# Patient Record
Sex: Female | Born: 1970 | Hispanic: Yes | Smoking: Never smoker
Health system: Southern US, Community
[De-identification: ages and names within clinical notes are randomized; demographics above are authoritative.]

## PROBLEM LIST (undated history)

## (undated) DIAGNOSIS — N920 Excessive and frequent menstruation with regular cycle: Secondary | ICD-10-CM

## (undated) HISTORY — PX: HERNIA REPAIR: SHX51

## (undated) HISTORY — DX: Excessive and frequent menstruation with regular cycle: N92.0

## (undated) HISTORY — PX: CHOLECYSTECTOMY: SHX55

## (undated) HISTORY — PX: PARTIAL HYSTERECTOMY: SHX80

---

## 2008-02-07 HISTORY — PX: REDUCTION MAMMAPLASTY: SUR839

## 2017-02-07 ENCOUNTER — Encounter: Payer: Self-pay | Admitting: Certified Nurse Midwife

## 2017-02-07 ENCOUNTER — Ambulatory Visit (INDEPENDENT_AMBULATORY_CARE_PROVIDER_SITE_OTHER): Payer: BLUE CROSS/BLUE SHIELD | Admitting: Certified Nurse Midwife

## 2017-02-07 VITALS — BP 106/69 | HR 92 | Ht 61.0 in | Wt 146.3 lb

## 2017-02-07 DIAGNOSIS — N6320 Unspecified lump in the left breast, unspecified quadrant: Secondary | ICD-10-CM

## 2017-02-07 NOTE — Progress Notes (Signed)
GYN ENCOUNTER NOTE  Subjective:       Isabel Miller is a 47 y.o. G66P1011 female is here for gynecologic evaluation of the following issues:  1. . Lump of her left breast that she states she noticed one month ago. She states that it has gotten larger and is now painful. She states that her last check up [place] coming [place] everything was normal . She denies any history of cancer for herself her in her immediate family. She states she takes in minimal caffeine - 1 cup coffee a day. She denies high in take of salt.    Gynecologic History No LMP recorded. Patient has had a hysterectomy. Contraception: none and menaupasal due to hysterectomy  Last Pap:2018 in her country all was normal . Last mammogram: 2018 in her Country . Results were: normal  Obstetric History OB History  Gravida Para Term Preterm AB Living  2 1 1   1 1   SAB TAB Ectopic Multiple Live Births  1       1    # Outcome Date GA Lbr Len/2nd Weight Sex Delivery Anes PTL Lv  2 Term 2008    M CS-LTranv   LIV  1 SAB 2006              Past Medical History:  Diagnosis Date  . Heavy menses     Past Surgical History:  Procedure Laterality Date  . ABDOMINAL HYSTERECTOMY    . BREAST REDUCTION SURGERY    . CESAREAN SECTION    . GALLBLADDER SURGERY    . HERNIA REPAIR      No current outpatient medications on file prior to visit.   No current facility-administered medications on file prior to visit.     Allergies  Allergen Reactions  . Ivp Dye [Iodinated Diagnostic Agents] Other (See Comments)    convulsion    Social History   Socioeconomic History  . Marital status: Divorced    Spouse name: Not on file  . Number of children: Not on file  . Years of education: Not on file  . Highest education level: Not on file  Social Needs  . Financial resource strain: Not on file  . Food insecurity - worry: Not on file  . Food insecurity - inability: Not on file  . Transportation needs - medical: Not on file  .  Transportation needs - non-medical: Not on file  Occupational History  . Not on file  Tobacco Use  . Smoking status: Never Smoker  . Smokeless tobacco: Never Used  Substance and Sexual Activity  . Alcohol use: Yes    Comment: occas  . Drug use: No  . Sexual activity: Not Currently  Other Topics Concern  . Not on file  Social History Narrative  . Not on file    Family History  Problem Relation Age of Onset  . Breast cancer Neg Hx   . Ovarian cancer Neg Hx   . Colon cancer Neg Hx   . Diabetes Neg Hx     The following portions of the patient's history were reviewed and updated as appropriate: allergies, current medications, past family history, past medical history, past social history, past surgical history and problem list.  Review of Systems Review of Systems - Breast ROS: positive for - new or changing breast lumps Review of Systems - General ROS: negative for - chills, fatigue, fever, hot flashes, malaise or night sweats Hematological and Lymphatic ROS: negative for - bleeding problems or  swollen lymph nodes Gastrointestinal ROS: negative for - abdominal pain, blood in stools, change in bowel habits and nausea/vomiting Musculoskeletal ROS: negative for - joint pain, muscle pain or muscular weakness Genito-Urinary ROS: negative for - dyspareunia, dysuria, genital discharge, genital ulcers, hematuria, incontinence,  nocturia or pelvic pain  Objective:   BP 106/69   Pulse 92   Ht 5\' 1"  (1.549 m)   Wt 146 lb 4.8 oz (66.4 kg)   BMI 27.64 kg/m  CONSTITUTIONAL: Well-developed, well-nourished female in no acute distress.  HENT:  Normocephalic, atraumatic.  NECK: Normal range of motion, supple,  SKIN: Skin is warm and dry. No rash noted. Not diaphoretic. No erythema. No pallor. [place]: Alert and oriented to person, place, and time.  PSYCHIATRIC: Normal mood and affect. Normal behavior. Normal judgment and thought content. CARDIOVASCULAR:RRR, pink  RESPIRATORY: clear  bilaterally, regular , no signs of distress BREASTS: Bilateral breast reduction. Scars noted on both breast below the breast and around areola. Left breast-irregular  Lump, firm between 11-12 o'clock.  3 cm x 3 cm. Moveable , no redness or swelling. Non tender to palpation. Pt states that it has enlarged over the month.  ABDOMEN: Soft, non distended; Non tender.  No Organomegaly. PELVIC: not indicated MUSCULOSKELETAL: Normal range of motion. No tenderness.  No cyanosis, clubbing, or edema.  Assessment:   1. Left breast mass - US BREAST LTD UNI LEFT INC AXILLA; Future - US BREAST LTD UNI RIGHT INC AXILLA; Future - MM Digital Diagnostic Unilat L; Future - MM DIAG BREAST TOMO BILATERAL; Future   Plan:  Mammogram and ultrasound ordered. Will follow up with results. Pt return if symptoms worsen or PRN.   I attest that more than 50% of time spent discussing pt history, reviewing differential diagnosis,  and reviewing plan of care.   Philip Aspen, CNM

## 2017-02-07 NOTE — Patient Instructions (Signed)
Breast Cyst A breast cyst is a sac in the breast that is filled with fluid. Breast cysts are usually noncancerous (benign). They are common among women, and they are most often located in the upper, outer portion of the breast. One or more cysts may develop. They form when fluid builds up inside of the breast glands. There are several types of breast cysts:  Macrocyst. This is a cyst that is about 2 inches (5.1 cm) across (in diameter).  Microcyst. This is a very small cyst that you cannot feel, but it can be seen with imaging tests such as an X-ray of the breast (mammogram) or ultrasound.  Galactocele. This is a cyst that contains milk. It may develop if you suddenly stop breastfeeding.  Breast cysts do not increase your risk of breast cancer. They usually disappear after menopause, unless you take artificial hormones (are on hormone therapy). What are the causes? The exact cause of breast cysts is not known. Possible causes include:  Blockage of tubes (ducts) in the breast glands, which leads to fluid buildup. Duct blockage may result from: ? Fibrocystic breast changes. This is a common, benign condition that occurs when women go through hormonal changes during the menstrual cycle. This is a common cause of multiple breast cysts. ? Overgrowth of breast tissue or breast glands. ? Scar tissue in the breast from previous surgery.  Changes in certain female hormones (estrogen and progesterone).  What increases the risk? You may be more likely to develop breast cysts if you have not gone through menopause. What are the signs or symptoms? Symptoms of a breast cyst may include:  Feeling one or more smooth, round, soft lumps (like grapes) in the breast that are easily moveable. The lump(s) may get bigger and more painful before your period and get smaller after your period.  Breast discomfort or pain.  How is this diagnosed? A cyst can be felt during a physical exam by your health care  provider. A mammogram and ultrasound will be done to confirm the diagnosis. Fluid may be removed from the cyst with a needle (fine-needle aspiration) and tested to make sure the cyst is not cancerous. How is this treated? Treatment may not be necessary. Your health care provider may monitor the cyst to see if it goes away on its own. If the cyst is uncomfortable or gets bigger, or if you do not like how the cyst makes your breast look, you may need treatment. Treatment may include:  Hormone treatment.  Fine-needle aspiration, to drain fluid from the cyst. There is a chance of the cyst coming back (recurring) after aspiration.  Surgery to remove the cyst.  Follow these instructions at home:  See your health care provider regularly. ? Get a yearly physical exam. ? If you are 20-40 years old, get a clinical breast exam every 1-3 years. After age 40, get this exam every year. ? Get mammograms as often as directed.  Do a breast self-exam every month, or as often as directed. Having many breast cysts, or "lumpy" breasts, may make it harder to feel for new lumps. Understand how your breasts normally look and feel, and write down any changes in your breasts so you can tell your health care provider about the changes. A breast self-exam involves: ? Comparing your breasts in the mirror. ? Looking for visible changes in your skin or nipples. ? Feeling for lumps or changes.  Take over-the-counter and prescription medicines only as told by your health care   provider.  Wear a supportive bra, especially when exercising.  Follow instructions from your health care provider about eating and drinking restrictions. ? Avoid caffeine. ? Cut down on salt (sodium) in what you eat and drink, especially before your menstrual period. Too much sodium can cause fluid buildup (retention), breast swelling, and discomfort.  Keep all follow-up visits as told your health care provider. This is important. Contact a  health care provider if:  You feel, or think you feel, a lump in your breast.  You notice that both breasts look or feel different than usual.  Your breast is still causing pain after your menstrual period is over.  You find new lumps or bumps that were not there before.  You feel lumps in your armpit (axilla). Get help right away if:  You have severe pain, tenderness, redness, or warmth in your breast.  You have fluid or blood leaking from your nipple.  Your breast lump becomes hard and painful.  You notice dimpling or wrinkling of the breast or nipple. This information is not intended to replace advice given to you by your health care provider. Make sure you discuss any questions you have with your health care provider. Document Released: 01/23/2005 Document Revised: 10/15/2015 Document Reviewed: 10/15/2015 Elsevier Interactive Patient Education  2017 [employer] Inc[employer]  

## 2017-02-14 DIAGNOSIS — R300 Dysuria: Secondary | ICD-10-CM | POA: Diagnosis not present

## 2017-02-19 ENCOUNTER — Ambulatory Visit
Admission: RE | Admit: 2017-02-19 | Discharge: 2017-02-19 | Disposition: A | Discharge status: Home / Self Care | Payer: BLUE CROSS/BLUE SHIELD | Source: Ambulatory Visit | Attending: Certified Nurse Midwife | Admitting: Certified Nurse Midwife

## 2017-02-19 DIAGNOSIS — N6002 Solitary cyst of left breast: Secondary | ICD-10-CM | POA: Insufficient documentation

## 2017-02-19 DIAGNOSIS — N6320 Unspecified lump in the left breast, unspecified quadrant: Secondary | ICD-10-CM

## 2017-02-19 DIAGNOSIS — R922 Inconclusive mammogram: Secondary | ICD-10-CM | POA: Diagnosis not present

## 2017-02-19 DIAGNOSIS — N6489 Other specified disorders of breast: Secondary | ICD-10-CM | POA: Diagnosis not present

## 2017-02-27 ENCOUNTER — Ambulatory Visit: Payer: BLUE CROSS/BLUE SHIELD | Admitting: Internal Medicine

## 2017-02-27 ENCOUNTER — Encounter: Payer: Self-pay | Admitting: Internal Medicine

## 2017-02-27 VITALS — BP 118/68 | HR 101 | Ht 61.0 in | Wt 145.0 lb

## 2017-02-27 DIAGNOSIS — F39 Unspecified mood [affective] disorder: Secondary | ICD-10-CM | POA: Diagnosis not present

## 2017-02-27 DIAGNOSIS — R300 Dysuria: Secondary | ICD-10-CM | POA: Diagnosis not present

## 2017-02-27 DIAGNOSIS — R21 Rash and other nonspecific skin eruption: Secondary | ICD-10-CM

## 2017-02-27 HISTORY — DX: Rash and other nonspecific skin eruption: R21

## 2017-02-27 LAB — POCT URINALYSIS DIPSTICK
Bilirubin, UA: NEGATIVE
Glucose, UA: NEGATIVE
Ketones, UA: NEGATIVE
Leukocytes, UA: NEGATIVE
Nitrite, UA: NEGATIVE
Protein, UA: NEGATIVE
Spec Grav, UA: 1.01 (ref 1.010–1.025)
Urobilinogen, UA: 0.2 E.U./dL
pH, UA: 5 (ref 5.0–8.0)

## 2017-02-27 MED ORDER — TRIAMCINOLONE ACETONIDE 0.1 % EX CREA: 1.0000 "application " | TOPICAL_CREAM | Freq: Two times a day (BID) | CUTANEOUS | 0 refills | Status: DC

## 2017-02-27 NOTE — Progress Notes (Signed)
Date:  02/27/2017   Name:  Isabel Miller   DOB:  01-09-1971   MRN:  [id number]   Chief Complaint: Establish Care (Been in [place] for 6 months. From [place]. Need family doctor. ); Urinary Tract Infection (Little itching, pain is gone. but [facility] said not bacterial infection. Wants to know if still infection of some sort. ); and Rash (Starting yesterday. Small pain and itching. Skin is very dry. Rash is located on both arms in front of elbow and on both legs. )  Urinary Tract Infection   The current episode started 1 to 4 weeks ago. The problem has been gradually improving. Quality: slight itching with urination. The patient is experiencing no pain. There has been no fever. Pertinent negatives include no chills or hematuria. She has tried antibiotics (much improved after antibiotics) for the symptoms.  Rash  This is a new problem. The current episode started yesterday. The affected locations include the right elbow, left elbow, right lower leg and left lower leg. She was exposed to nothing. Pertinent negatives include no fatigue, fever, joint pain or shortness of breath. Past treatments include nothing.  Mood disorder - has been stressed moving to [place] with only her 49 yr old son.  She has been here for 6 months and has committed to next year as well.  She is not sleeping as well.  Some stress over family issues at home that she can not help with.  She has taken an otc homeopathic stress relief that has helped.  She has something similar for sleep that her sister sent her from [place].    Review of Systems  Constitutional: Negative for chills, fatigue and fever.  Respiratory: Negative for chest tightness and shortness of breath.   Cardiovascular: Negative for chest pain and palpitations.  Genitourinary: Positive for dysuria. Negative for hematuria.  Musculoskeletal: Negative for arthralgias, gait problem and joint pain.  Skin: Positive for color change and rash.    Neurological: Negative for dizziness and headaches.  Psychiatric/Behavioral: Positive for dysphoric mood and sleep disturbance.    There are no active problems to display for this patient.   Prior to Admission medications   Medication Sig Start Date End Date Taking? Authorizing Provider  loratadine (CLARITIN) 10 MG tablet Take 10 mg by mouth daily.   Yes [provider]  Multiple Vitamins-Minerals (MULTIVITAMIN WITH MINERALS) tablet Take 1 tablet by mouth daily.   Yes [provider]  Pumpkin Seed-Soy Germ (AZO BLADDER CONTROL/GO-LESS PO) Take by mouth.   Yes [provider]  Marnee Guarneri Isoflavones (SOY BALANCE PO) Take by mouth.   Yes [provider]    Allergies  Allergen Reactions  . Ivp Dye [Iodinated Diagnostic Agents] Other (See Comments)    convulsion    Past Surgical History:  Procedure Laterality Date  . ABDOMINAL HYSTERECTOMY    . BREAST REDUCTION SURGERY    . CESAREAN SECTION    . GALLBLADDER SURGERY    . HERNIA REPAIR    . REDUCTION MAMMAPLASTY Bilateral 2010    Social History   Tobacco Use  . Smoking status: Never Smoker  . Smokeless tobacco: Never Used  Substance Use Topics  . Alcohol use: Yes    Alcohol/week: 0.6 oz    Types: 1 Glasses of wine per week    Comment: occas  . Drug use: No     Medication list has been reviewed and updated.  PHQ 2/9 Scores 02/27/2017  PHQ - 2 Score  3  PHQ- 9 Score 4    Physical Exam  Constitutional: She is oriented to person, place, and time. She appears well-developed. No distress.  HENT:  Head: Normocephalic and atraumatic.  Neck: Normal range of motion. Neck supple. No thyromegaly present.  Cardiovascular: Normal rate, regular rhythm and normal heart sounds.  Pulmonary/Chest: Effort normal and breath sounds normal. No respiratory distress. She has no wheezes.  Abdominal: Soft. There is no tenderness.  Musculoskeletal: She exhibits no edema.  Neurological: She is alert and  oriented to person, place, and time.  Skin: Skin is warm and dry. Rash noted. Rash is macular.  Scaly dry rash in both antecubital fossae and on lower legs in a more diffuse pattern.  Psychiatric: She has a normal mood and affect. Her speech is normal and behavior is normal. Thought content normal. Her mood appears not anxious. She does not exhibit a depressed mood.  Nursing note and vitals reviewed.  Urine dipstick shows negative for all components.  Micro exam: not done.  BP 118/68 (BP Location: Right Arm, Patient Position: Sitting, Cuff Size: Normal)   Pulse (!) 101   Ht 5\' 1"  (1.549 m)   Wt 145 lb (65.8 kg)   SpO2 98%   BMI 27.40 kg/m   Assessment and Plan: 1. Dysuria UTI resolved - POCT urinalysis dipstick  2. Rash and nonspecific skin eruption Use Cerave moisturizer regularly TAC on rash bid claritin or benadryl for itching - triamcinolone cream (KENALOG) 0.1 %; Apply 1 application topically 2 (two) times daily.  Dispense: 30 g; Refill: 0  3. Mood disorder ([place]) Continue homeopathic stress reducer May take otc sleep aid if helpful Follow up if needed   Meds ordered this encounter  Medications  . triamcinolone cream (KENALOG) 0.1 %    Sig: Apply 1 application topically 2 (two) times daily.    Dispense:  30 g    Refill:  0    Partially dictated using Editor, commissioning. Any errors are unintentional.  Halina Maidens, MD [facility]  02/27/2017

## 2017-02-27 NOTE — Patient Instructions (Signed)
For itching of skin - take Claritin in the morning and Benadryl at night  Cerave skin cream recommended

## 2017-03-07 ENCOUNTER — Other Ambulatory Visit: Payer: Self-pay

## 2017-03-07 DIAGNOSIS — R21 Rash and other nonspecific skin eruption: Secondary | ICD-10-CM

## 2017-03-07 NOTE — Progress Notes (Signed)
Patient called stating rash on arms and legs is no better. Still itching and now painful. Called pt and informed on VM we will be sending in a referral for her to see Dr Phillip Heal here in [facility] for dermatolofy consult.   Told her to call if questions otherwise they should contact her in the next few weeks.

## 2017-03-09 DIAGNOSIS — L272 Dermatitis due to ingested food: Secondary | ICD-10-CM | POA: Diagnosis not present

## 2017-08-02 DIAGNOSIS — K05322 Chronic periodontitis, generalized, moderate: Secondary | ICD-10-CM | POA: Diagnosis not present

## 2017-08-02 DIAGNOSIS — Z1281 Encounter for screening for malignant neoplasm of oral cavity: Secondary | ICD-10-CM | POA: Diagnosis not present

## 2017-08-02 DIAGNOSIS — J320 Chronic maxillary sinusitis: Secondary | ICD-10-CM | POA: Diagnosis not present

## 2017-08-02 DIAGNOSIS — K029 Dental caries, unspecified: Secondary | ICD-10-CM | POA: Diagnosis not present

## 2017-08-02 DIAGNOSIS — J342 Deviated nasal septum: Secondary | ICD-10-CM | POA: Diagnosis not present

## 2017-09-19 ENCOUNTER — Encounter: Payer: Self-pay | Admitting: Internal Medicine

## 2017-09-19 ENCOUNTER — Ambulatory Visit: Payer: BLUE CROSS/BLUE SHIELD | Admitting: Internal Medicine

## 2017-09-19 VITALS — BP 110/64 | HR 75 | Ht 61.0 in | Wt 150.0 lb

## 2017-09-19 DIAGNOSIS — M25562 Pain in left knee: Secondary | ICD-10-CM

## 2017-09-19 DIAGNOSIS — M25561 Pain in right knee: Secondary | ICD-10-CM | POA: Diagnosis not present

## 2017-09-19 DIAGNOSIS — G8929 Other chronic pain: Secondary | ICD-10-CM

## 2017-09-19 DIAGNOSIS — R609 Edema, unspecified: Secondary | ICD-10-CM | POA: Diagnosis not present

## 2017-09-19 DIAGNOSIS — J3089 Other allergic rhinitis: Secondary | ICD-10-CM | POA: Diagnosis not present

## 2017-09-19 NOTE — Progress Notes (Signed)
Date:  09/19/2017   Name:  Isabel Miller   DOB:  02/24/1970   MRN:  [id number]   Chief Complaint: Edema (When walking a lot knees down to feet swell on both sides. 2 weeks ago. Painful when swelling. Not painful currently.) and Allergic Rhinitis  (Losing voice and having to try and clear throat constantly. )  Leg swelling - started while visiting family in [place], doing a lot of walking and not drinking much water.  It is much improved now with only slight ankle swelling.  No redness or warmth.  She has no SOB, cough or chest pain.  No hs of DVT.  Hoarseness - has allergies and PND, sometimes gets hoarse and has to clear her throat which improves her voice. No discolored sputum noted.  Not taking Claritin or Benedryl regularly.  Knee pain - knees have been stiff since her vacation.  No weakness or buckling.  Mild swelling noted.  Has not been taking any anti inflammatories.  Review of Systems  Constitutional: Negative for chills, fatigue and fever.  HENT: Positive for postnasal drip and voice change. Negative for sinus pressure and trouble swallowing.   Respiratory: Negative for cough, choking, shortness of breath and wheezing.   Cardiovascular: Positive for leg swelling. Negative for chest pain and palpitations.  Gastrointestinal: Negative for abdominal pain.  Genitourinary: Negative for difficulty urinating.  Musculoskeletal: Negative for arthralgias and joint swelling.  Skin: Negative for color change and rash.  Neurological: Negative for dizziness, light-headedness and headaches.  Psychiatric/Behavioral: Positive for sleep disturbance.    Patient Active Problem List   Diagnosis Date Noted  . Rash and nonspecific skin eruption 02/27/2017  . Mood disorder ([place]) 02/27/2017    Allergies  Allergen Reactions  . Ivp Dye [Iodinated Diagnostic Agents] Other (See Comments)    convulsion    Past Surgical History:  Procedure Laterality Date  . CESAREAN SECTION     . CHOLECYSTECTOMY    . HERNIA REPAIR    . PARTIAL HYSTERECTOMY     bleeding  . REDUCTION MAMMAPLASTY Bilateral 2010    Social History   Tobacco Use  . Smoking status: Never Smoker  . Smokeless tobacco: Never Used  Substance Use Topics  . Alcohol use: Yes    Alcohol/week: 1.0 standard drinks    Types: 1 Glasses of wine per week    Comment: occas  . Drug use: No     Medication list has been reviewed and updated.  Current Meds  Medication Sig  . Biotin 1 MG CAPS Take by mouth.  . Melatonin 1 MG/4ML LIQD Take by mouth.  . Multiple Vitamins-Minerals (MULTIVITAMIN WITH MINERALS) tablet Take 1 tablet by mouth daily.    PHQ 2/9 Scores 02/27/2017  PHQ - 2 Score 3  PHQ- 9 Score 4    Physical Exam  Constitutional: She is oriented to person, place, and time. She appears well-developed. No distress.  HENT:  Head: Normocephalic and atraumatic.  Neck: Normal range of motion. Neck supple.  Cardiovascular: Normal rate, regular rhythm and normal heart sounds.  Pulmonary/Chest: Effort normal and breath sounds normal. No respiratory distress.  Musculoskeletal: Normal range of motion. She exhibits edema (trace edema).  Mild crepitus both knees  Lymphadenopathy:    She has no cervical adenopathy.  Neurological: She is alert and oriented to person, place, and time.  Skin: Skin is warm and dry. No rash noted.  Psychiatric: She has a normal mood and affect. Her behavior is normal.  Thought content normal.  Nursing note and vitals reviewed.   BP 110/64   Pulse 75   Ht 5\' 1"  (1.549 m)   Wt 150 lb (68 kg)   SpO2 99%   BMI 28.34 kg/m   Assessment and Plan: 1. Dependent edema Improved - will check labs, increase water, elevate if needed  2. Environmental and seasonal allergies Take claritin every day May need ENT evaluation if not improved  3. Chronic pain of both knees Recommend supportive shoes Tylenol as needed   No orders of the defined types were placed in this  encounter.   Partially dictated using Editor, commissioning. Any errors are unintentional.  Halina Maidens, MD [facility]  09/19/2017

## 2017-09-19 NOTE — Patient Instructions (Addendum)
Easy spirit shoes  Tylenol 650 mg 2-3 times a day as needed for knee pain  Take Claritin every day for allergy sx

## 2017-09-20 LAB — CBC WITH DIFFERENTIAL/PLATELET
Basophils Absolute: 0 10*3/uL (ref 0.0–0.2)
Basos: 0 %
EOS (ABSOLUTE): 0.1 10*3/uL (ref 0.0–0.4)
Eos: 1 %
Hematocrit: 39.9 % (ref 34.0–46.6)
Hemoglobin: 13.1 g/dL (ref 11.1–15.9)
Immature Grans (Abs): 0 10*3/uL (ref 0.0–0.1)
Immature Granulocytes: 0 %
Lymphocytes Absolute: 2.3 10*3/uL (ref 0.7–3.1)
Lymphs: 27 %
MCH: 29.4 pg (ref 26.6–33.0)
MCHC: 32.8 g/dL (ref 31.5–35.7)
MCV: 90 fL (ref 79–97)
Monocytes Absolute: 0.6 10*3/uL (ref 0.1–0.9)
Monocytes: 7 %
Neutrophils Absolute: 5.6 10*3/uL (ref 1.4–7.0)
Neutrophils: 65 %
Platelets: 331 10*3/uL (ref 150–450)
RBC: 4.46 x10E6/uL (ref 3.77–5.28)
RDW: 14.2 % (ref 12.3–15.4)
WBC: 8.6 10*3/uL (ref 3.4–10.8)

## 2017-09-20 LAB — COMPREHENSIVE METABOLIC PANEL
ALT: 38 IU/L — ABNORMAL HIGH (ref 0–32)
AST: 31 IU/L (ref 0–40)
Albumin/Globulin Ratio: 1.5 (ref 1.2–2.2)
Albumin: 4.6 g/dL (ref 3.5–5.5)
Alkaline Phosphatase: 62 IU/L (ref 39–117)
BUN/Creatinine Ratio: 18 (ref 9–23)
BUN: 10 mg/dL (ref 6–24)
Bilirubin Total: 0.3 mg/dL (ref 0.0–1.2)
CO2: 21 mmol/L (ref 20–29)
Calcium: 9.9 mg/dL (ref 8.7–10.2)
Chloride: 98 mmol/L (ref 96–106)
Creatinine, Ser: 0.55 mg/dL — ABNORMAL LOW (ref 0.57–1.00)
GFR calc Af Amer: 128 mL/min/{1.73_m2} (ref >59)
GFR calc non Af Amer: 111 mL/min/{1.73_m2} (ref >59)
Globulin, Total: 3 g/dL (ref 1.5–4.5)
Glucose: 91 mg/dL (ref 65–99)
Potassium: 4.2 mmol/L (ref 3.5–5.2)
Sodium: 136 mmol/L (ref 134–144)
Total Protein: 7.6 g/dL (ref 6.0–8.5)

## 2017-09-20 LAB — TSH: TSH: 1.48 u[IU]/mL (ref 0.450–4.500)

## 2017-10-22 ENCOUNTER — Ambulatory Visit
Admission: RE | Admit: 2017-10-22 | Discharge: 2017-10-22 | Disposition: A | Discharge status: Home / Self Care | Payer: BLUE CROSS/BLUE SHIELD | Source: Ambulatory Visit | Attending: Internal Medicine | Admitting: Internal Medicine

## 2017-10-22 ENCOUNTER — Ambulatory Visit: Payer: BLUE CROSS/BLUE SHIELD | Admitting: Internal Medicine

## 2017-10-22 ENCOUNTER — Encounter: Payer: Self-pay | Admitting: Internal Medicine

## 2017-10-22 VITALS — BP 94/68 | HR 81 | Ht 61.0 in | Wt 160.0 lb

## 2017-10-22 DIAGNOSIS — M25562 Pain in left knee: Secondary | ICD-10-CM

## 2017-10-22 DIAGNOSIS — M722 Plantar fascial fibromatosis: Secondary | ICD-10-CM | POA: Diagnosis not present

## 2017-10-22 DIAGNOSIS — M170 Bilateral primary osteoarthritis of knee: Secondary | ICD-10-CM | POA: Diagnosis not present

## 2017-10-22 DIAGNOSIS — G8929 Other chronic pain: Secondary | ICD-10-CM

## 2017-10-22 DIAGNOSIS — G5601 Carpal tunnel syndrome, right upper limb: Secondary | ICD-10-CM | POA: Diagnosis not present

## 2017-10-22 DIAGNOSIS — M25561 Pain in right knee: Secondary | ICD-10-CM

## 2017-10-22 DIAGNOSIS — M1712 Unilateral primary osteoarthritis, left knee: Secondary | ICD-10-CM | POA: Diagnosis not present

## 2017-10-22 DIAGNOSIS — M1711 Unilateral primary osteoarthritis, right knee: Secondary | ICD-10-CM | POA: Diagnosis not present

## 2017-10-22 MED ORDER — PREDNISONE 10 MG PO TABS: ORAL_TABLET | ORAL | 0 refills | Status: DC

## 2017-10-22 NOTE — Progress Notes (Signed)
Date:  10/22/2017   Name:  Isabel Miller   DOB:  12/17/1970   MRN:  [id number]   Chief Complaint: Knee Pain (Left knee- pain in knee. Going up and down stairs. Can hear knee cracking. X 1 month. No swelling.  ) and Foot Pain (Pain on the heel of right foot. Tried shoe pad and no relief. Started 1 month ago. )  Knee Pain   There was no injury mechanism. The pain is present in the left knee and right knee. The pain is moderate. The pain has been fluctuating since onset. Associated symptoms include an inability to bear weight. Associated symptoms comments: Pain with going up or down stairs along with popping and clicking. She has tried acetaminophen (only taking once a day) for the symptoms. The treatment provided mild relief.  Foot Pain  This is a new problem. The problem has been waxing and waning. Associated symptoms include arthralgias. Pertinent negatives include no chest pain, fatigue, headaches, joint swelling, myalgias or rash. Associated symptoms comments: Burning pain at the heels of both feet, worse in AM and again during the day after sitting. She has tried acetaminophen for the symptoms.  Numbness - in right hand and arm, usually while sleeping.  Has to shake hand to get feeling to come back.  No weakness or pain.  No injury.   Review of Systems  Constitutional: Negative for fatigue.  Respiratory: Negative for chest tightness and shortness of breath.   Cardiovascular: Negative for chest pain, palpitations and leg swelling.  Musculoskeletal: Positive for arthralgias and gait problem. Negative for joint swelling and myalgias.  Skin: Negative for rash.  Neurological: Negative for dizziness and headaches.    Patient Active Problem List   Diagnosis Date Noted  . Dependent edema 09/19/2017  . Environmental and seasonal allergies 09/19/2017  . Chronic pain of both knees 09/19/2017  . Rash and nonspecific skin eruption 02/27/2017  . Mood disorder ([place]) 02/27/2017     Allergies  Allergen Reactions  . Ivp Dye [Iodinated Diagnostic Agents] Other (See Comments)    convulsion    Past Surgical History:  Procedure Laterality Date  . CESAREAN SECTION    . CHOLECYSTECTOMY    . HERNIA REPAIR    . PARTIAL HYSTERECTOMY     bleeding  . REDUCTION MAMMAPLASTY Bilateral 2010    Social History   Tobacco Use  . Smoking status: Never Smoker  . Smokeless tobacco: Never Used  Substance Use Topics  . Alcohol use: Yes    Alcohol/week: 1.0 standard drinks    Types: 1 Glasses of wine per week    Comment: occas  . Drug use: No     Medication list has been reviewed and updated.  Current Meds  Medication Sig  . Biotin 1 MG CAPS Take by mouth.  . Melatonin 1 MG/4ML LIQD Take by mouth.  . Multiple Vitamins-Minerals (MULTIVITAMIN WITH MINERALS) tablet Take 1 tablet by mouth daily.    PHQ 2/9 Scores 02/27/2017  PHQ - 2 Score 3  PHQ- 9 Score 4    Physical Exam  Constitutional: She is oriented to person, place, and time. She appears well-developed. No distress.  HENT:  Head: Normocephalic and atraumatic.  Neck: Normal range of motion. Neck supple.  Cardiovascular: Normal rate, regular rhythm and normal heart sounds.  Pulmonary/Chest: Effort normal and breath sounds normal. No respiratory distress.  Musculoskeletal:       Right knee: She exhibits decreased range of motion. She exhibits  no swelling, no effusion and normal patellar mobility. Tenderness found. Lateral joint line tenderness noted.       Left knee: She exhibits decreased range of motion. She exhibits no effusion and normal patellar mobility. Tenderness found. Medial joint line tenderness noted.       Arms: Tender over both heels to palpation    Neurological: She is alert and oriented to person, place, and time.  Skin: Skin is warm and dry. No rash noted.  Psychiatric: She has a normal mood and affect. Her behavior is normal. Thought content normal.  Nursing note and vitals  reviewed.   BP 94/68 (BP Location: Right Arm, Patient Position: Sitting, Cuff Size: Normal)   Pulse 81   Ht 5\' 1"  (1.549 m)   Wt 160 lb (72.6 kg)   SpO2 99%   BMI 30.23 kg/m   Assessment and Plan: 1. Plantar fasciitis, bilateral Begin ice massage Comfortable shoes  2. Chronic pain of both knees Take prednisone taper the begin tylenol tid - predniSONE (DELTASONE) 10 MG tablet; Take 6 on day 1, 5 on day 2, 4 on day 3, 3 on day 4, 2 on day 5 and 1 on day 1 then stop.  Dispense: 21 tablet; Refill: 0 - DG Knee Complete 4 Views Left; Future - DG Knee Complete 4 Views Right; Future - Ambulatory referral to Orthopedic Surgery  3. Carpal tunnel syndrome of right wrist Should improve with steroid taper - predniSONE (DELTASONE) 10 MG tablet; Take 6 on day 1, 5 on day 2, 4 on day 3, 3 on day 4, 2 on day 5 and 1 on day 1 then stop.  Dispense: 21 tablet; Refill: 0   Meds ordered this encounter  Medications  . predniSONE (DELTASONE) 10 MG tablet    Sig: Take 6 on day 1, 5 on day 2, 4 on day 3, 3 on day 4, 2 on day 5 and 1 on day 1 then stop.    Dispense:  21 tablet    Refill:  0    Partially dictated using Editor, commissioning. Any errors are unintentional.  Halina Maidens, MD [facility]  10/22/2017

## 2017-10-22 NOTE — Patient Instructions (Addendum)
Ice massage both heels twice a day for 10 minutes  Take prednisone taper for 6 days then begin tylenol 2 tablets three times a day  Easy [employer] shoes

## 2017-11-19 DIAGNOSIS — J00 Acute nasopharyngitis [common cold]: Secondary | ICD-10-CM | POA: Diagnosis not present

## 2017-11-19 DIAGNOSIS — R05 Cough: Secondary | ICD-10-CM | POA: Diagnosis not present

## 2017-12-06 DIAGNOSIS — M170 Bilateral primary osteoarthritis of knee: Secondary | ICD-10-CM | POA: Diagnosis not present

## 2018-02-20 DIAGNOSIS — N3000 Acute cystitis without hematuria: Secondary | ICD-10-CM | POA: Diagnosis not present

## 2018-02-20 DIAGNOSIS — R300 Dysuria: Secondary | ICD-10-CM | POA: Diagnosis not present

## 2018-02-25 IMAGING — US US BREAST*L* LIMITED INC AXILLA
1 series · 7 of 7 positions shown · non-contrast
Comparison: None

CLINICAL DATA: Patient presents with palpable lump in the upper
left breast. This was initially larger more painful. It is now
smaller with only mild tenderness.

EXAM:
2D DIGITAL DIAGNOSTIC BILATERAL MAMMOGRAM WITH CAD AND ADJUNCT TOMO
ULTRASOUND LEFT BREAST

[Series 1: us breast*left* limited inc axilla · 0.06mm/px · 7 of 7 slices shown]
[im 1/7]
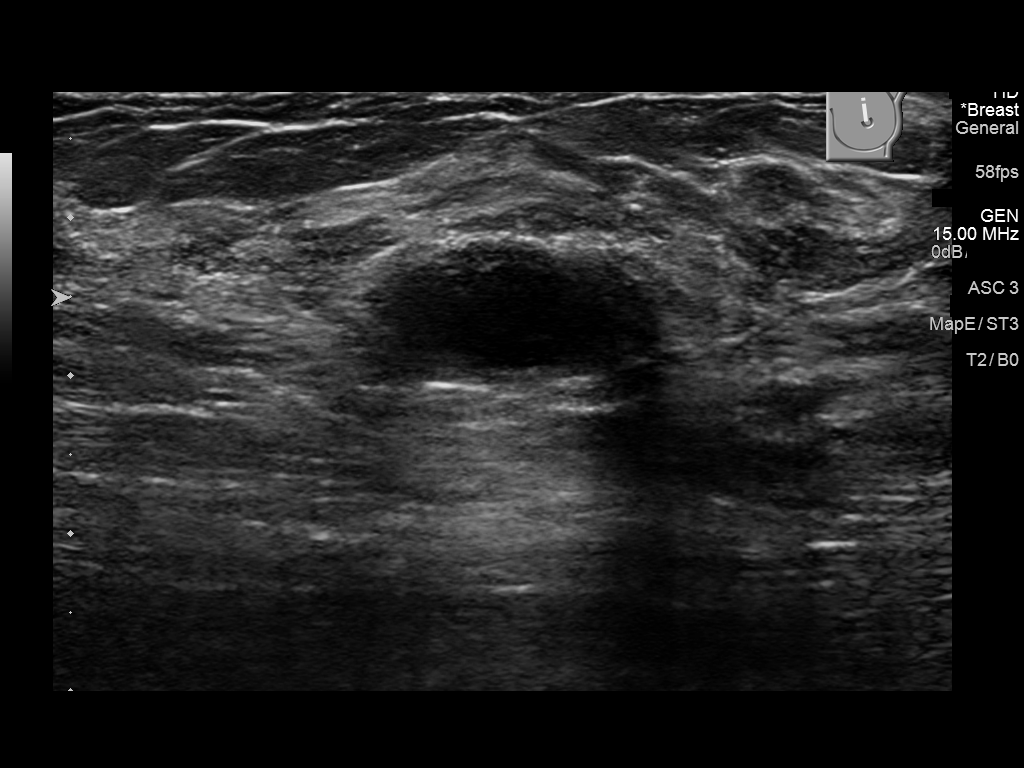
[im 2/7]
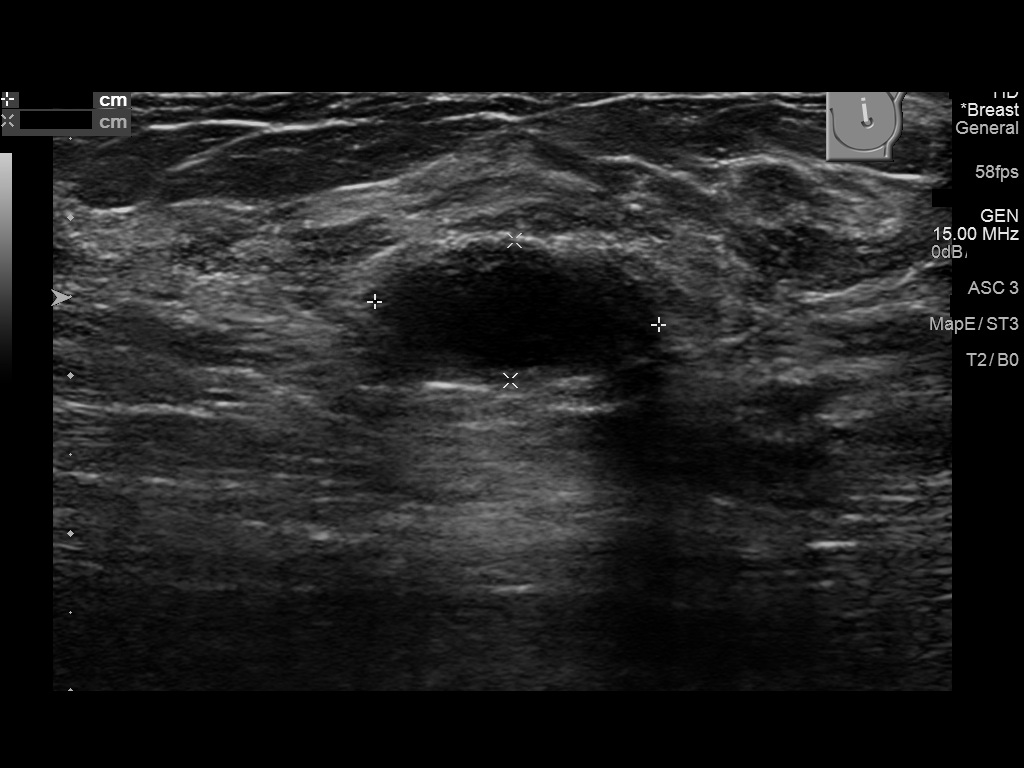
[im 3/7]
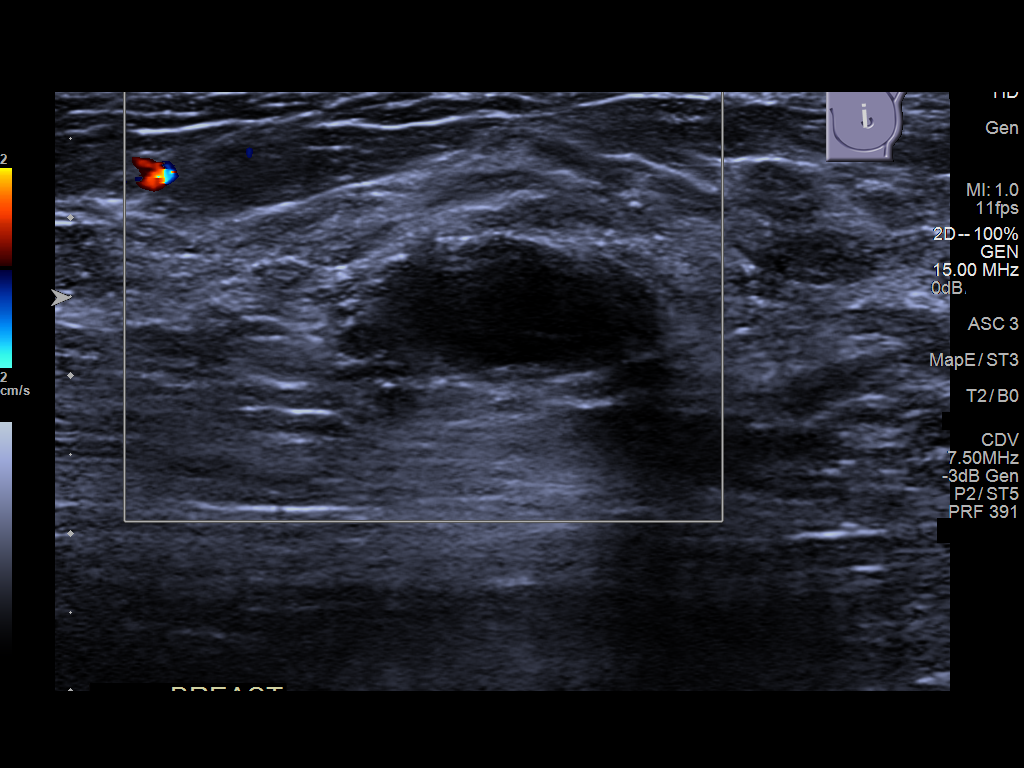
[im 4/7]
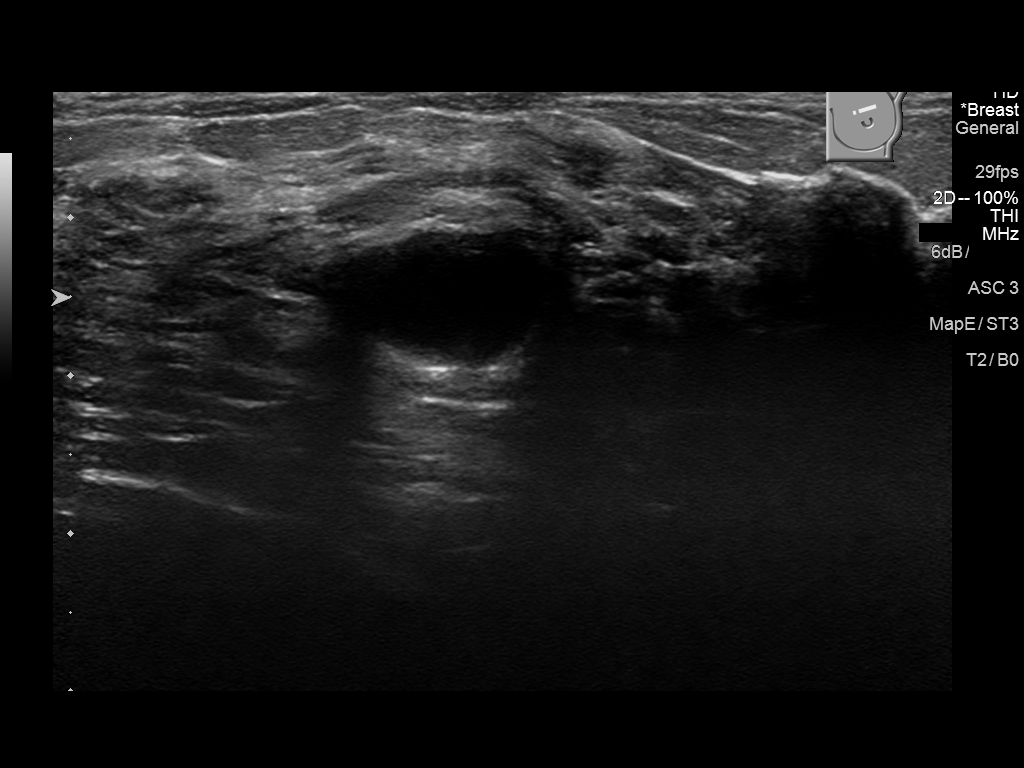
[im 5/7]
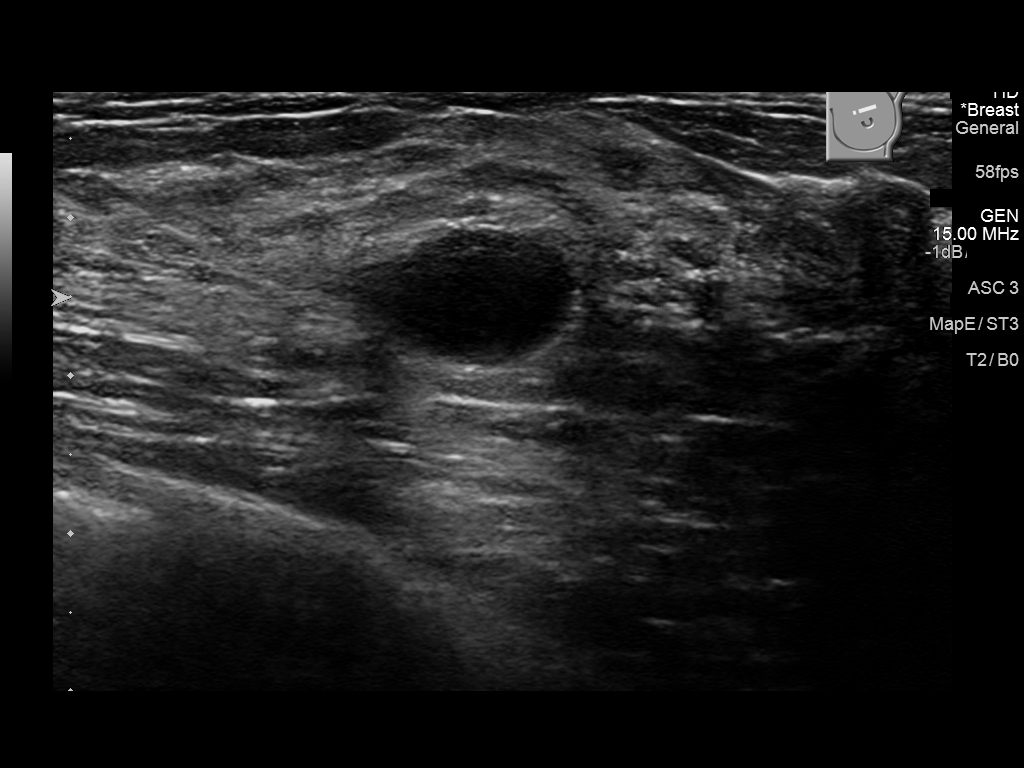
[im 6/7]
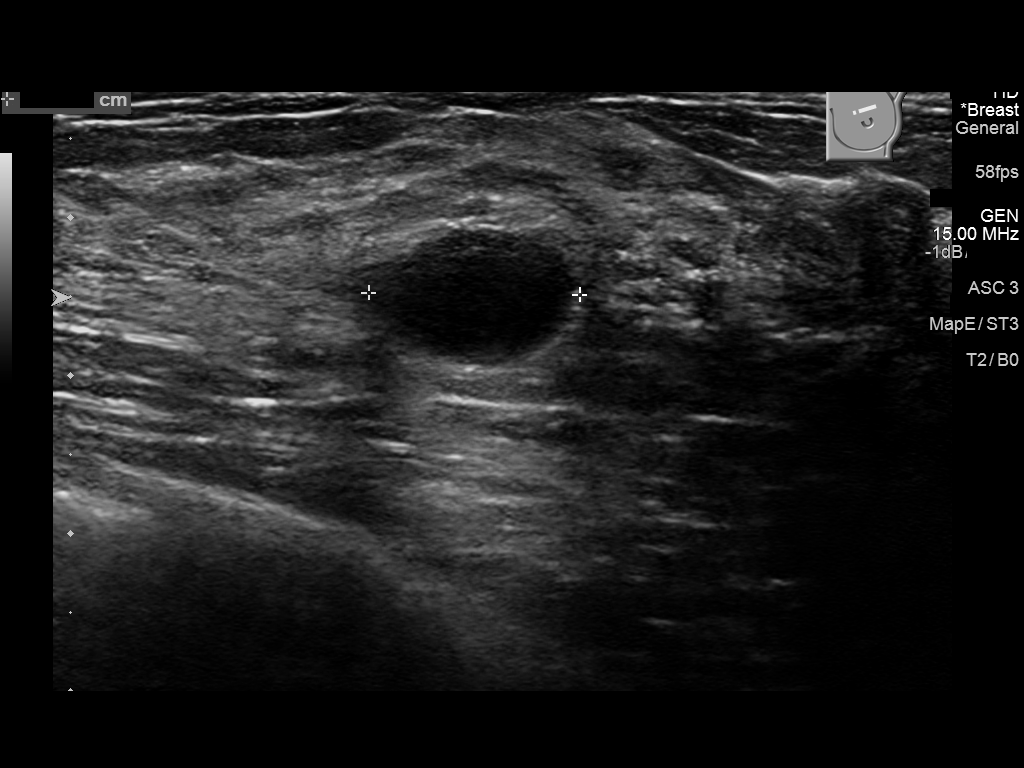
[im 7/7]
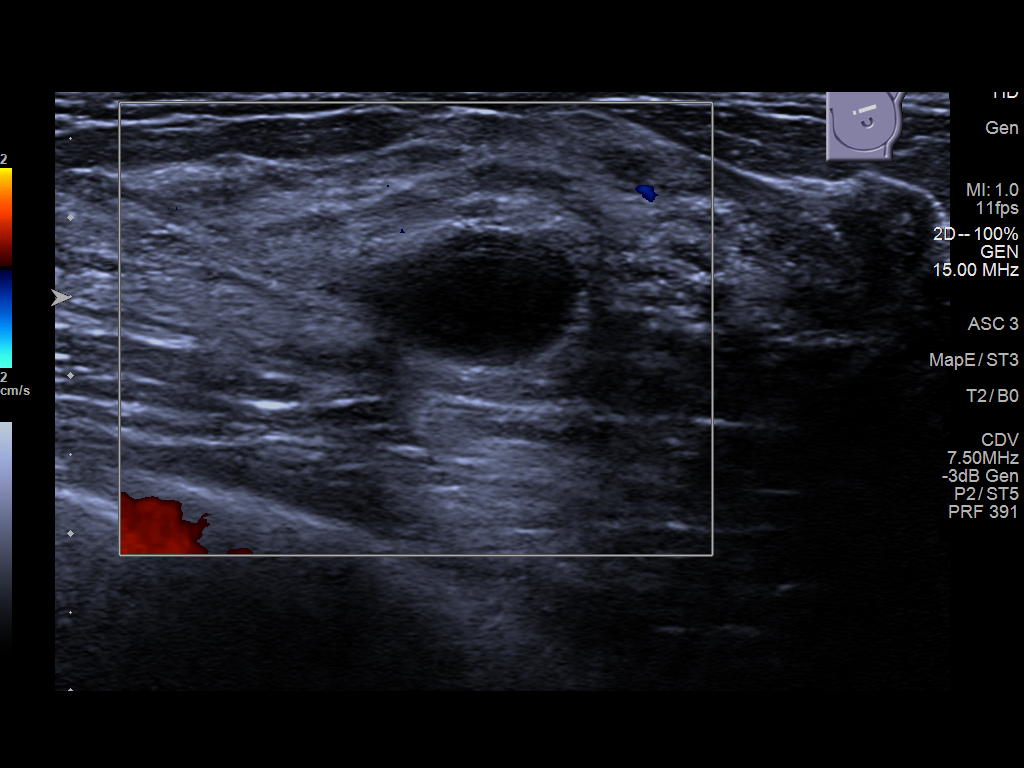

[7 of 7 positions shown; findings below may reference images not displayed]

ACR Breast Density Category d: The breast tissue is extremely dense,
which lowers the sensitivity of mammography.
FINDINGS: There is a questionable partly circumscribed mass in the upper left
breast corresponding to the palpable abnormality. There are no other
masses, no areas of architectural distortion and no suspicious
calcifications.

Mammographic images were processed with CAD.

On physical exam, there is a small smooth palpable lump in the upper
left breast.

Targeted ultrasound is performed, showing several cysts. The
palpable abnormality corresponds to a cyst at the 11:30 o'clock
position, 4 cm the nipple, measuring 1.8 x 0.9 x 1.3 cm. There are
no solid masses or suspicious lesions.
IMPRESSION: 1. No evidence of breast malignancy.
2. Benign left breast cysts.

RECOMMENDATION:
Screening mammogram in one year.(Code:[id number]-VK2)

I have discussed the findings and recommendations with the patient.
Results were also provided in writing at the conclusion of the
visit. If applicable, a reminder letter will be sent to the patient
regarding the next appointment.

BI-RADS CATEGORY  2: Benign.

## 2018-07-21 DIAGNOSIS — N3001 Acute cystitis with hematuria: Secondary | ICD-10-CM | POA: Diagnosis not present

## 2018-07-22 ENCOUNTER — Encounter: Payer: Self-pay | Admitting: Internal Medicine

## 2018-07-22 ENCOUNTER — Ambulatory Visit: Payer: BLUE CROSS/BLUE SHIELD | Admitting: Internal Medicine

## 2018-07-22 ENCOUNTER — Other Ambulatory Visit: Payer: Self-pay

## 2018-07-22 VITALS — BP 104/78 | HR 103 | Temp 98.0°F | Ht 61.0 in | Wt 157.6 lb

## 2018-07-22 DIAGNOSIS — M25561 Pain in right knee: Secondary | ICD-10-CM | POA: Diagnosis not present

## 2018-07-22 DIAGNOSIS — N3001 Acute cystitis with hematuria: Secondary | ICD-10-CM | POA: Diagnosis not present

## 2018-07-22 DIAGNOSIS — Z1231 Encounter for screening mammogram for malignant neoplasm of breast: Secondary | ICD-10-CM

## 2018-07-22 DIAGNOSIS — M25562 Pain in left knee: Secondary | ICD-10-CM

## 2018-07-22 DIAGNOSIS — G8929 Other chronic pain: Secondary | ICD-10-CM

## 2018-07-22 LAB — POCT URINALYSIS DIPSTICK
Bilirubin, UA: NEGATIVE
Blood, UA: NEGATIVE
Glucose, UA: NEGATIVE
Ketones, UA: NEGATIVE
Leukocytes, UA: NEGATIVE
Nitrite, UA: NEGATIVE
Protein, UA: NEGATIVE
Spec Grav, UA: 1.01 (ref 1.010–1.025)
Urobilinogen, UA: 0.2 E.U./dL
pH, UA: 6 (ref 5.0–8.0)

## 2018-07-22 NOTE — Progress Notes (Signed)
Date:  07/22/2018   Name:  Isabel Miller   DOB:  December 31, 1970   MRN:  [id number]   Chief Complaint: Urinary Tract Infection (Wednesday- last week holding urine. She does not feel comfortable going to public bathrooms. Friday started with itching, burning, and painful. Tried amoxicillin 500 mg 4am on Sunday with AZO. Blood was coming out yesterday and it scared her. MACROBID is what she is taking now. )  Urinary Tract Infection  This is a new problem. The current episode started in the past 7 days. The problem has been gradually improving. The patient is experiencing no pain. There has been no fever. She is not sexually active. There is no history of pyelonephritis. Associated symptoms include frequency, hematuria (yesterday but not today) and urgency. Pertinent negatives include no chills, discharge, flank pain or hesitancy. She has tried antibiotics (started macrobid yesterday) for the symptoms. The treatment provided significant (feels 80 % better since yesterday) relief.  Knee Pain  There was no injury mechanism. The pain is present in the left knee and right knee. The quality of the pain is described as aching. The pain is mild. The pain has been fluctuating since onset. The symptoms are aggravated by weight bearing. She has tried acetaminophen (seen by Ortho - no intervention was recommeded) for the symptoms. The treatment provided moderate relief.    Review of Systems  Constitutional: Positive for diaphoresis. Negative for chills, fatigue and fever.  Respiratory: Negative for chest tightness and shortness of breath.   Cardiovascular: Negative for chest pain and palpitations.  Genitourinary: Positive for frequency, hematuria (yesterday but not today) and urgency. Negative for difficulty urinating, flank pain and hesitancy.  Musculoskeletal: Positive for arthralgias. Negative for joint swelling and myalgias.  Allergic/Immunologic: Negative for environmental allergies.   Neurological: Negative for dizziness and headaches.    Patient Active Problem List   Diagnosis Date Noted  . Dependent edema 09/19/2017  . Environmental and seasonal allergies 09/19/2017  . Chronic pain of both knees 09/19/2017  . Rash and nonspecific skin eruption 02/27/2017  . Mood disorder ([place]) 02/27/2017    Allergies  Allergen Reactions  . Ivp Dye [Iodinated Diagnostic Agents] Other (See Comments)    convulsion    Past Surgical History:  Procedure Laterality Date  . CESAREAN SECTION    . CHOLECYSTECTOMY    . HERNIA REPAIR    . PARTIAL HYSTERECTOMY     bleeding  . REDUCTION MAMMAPLASTY Bilateral 2010    Social History   Tobacco Use  . Smoking status: Never Smoker  . Smokeless tobacco: Never Used  Substance Use Topics  . Alcohol use: Yes    Alcohol/week: 1.0 standard drinks    Types: 1 Glasses of wine per week    Comment: occas  . Drug use: No     Medication list has been reviewed and updated.  Current Meds  Medication Sig  . Biotin 1 MG CAPS Take by mouth.  . Melatonin 1 MG/4ML LIQD Take by mouth.  . Multiple Vitamins-Minerals (MULTIVITAMIN WITH MINERALS) tablet Take 1 tablet by mouth daily.  . nitrofurantoin, macrocrystal-monohydrate, (MACROBID) 100 MG capsule Take 100 mg by mouth 2 (two) times daily.    PHQ 2/9 Scores 07/22/2018 02/27/2017  PHQ - 2 Score 0 3  PHQ- 9 Score - 4    BP Readings from Last 3 Encounters:  07/22/18 104/78  10/22/17 94/68  09/19/17 110/64    Physical Exam Vitals signs and nursing note reviewed.  Constitutional:  Appearance: She is well-developed.  Cardiovascular:     Rate and Rhythm: Normal rate and regular rhythm.     Heart sounds: Normal heart sounds.  Pulmonary:     Effort: Pulmonary effort is normal. No respiratory distress.     Breath sounds: Normal breath sounds.  Abdominal:     General: Bowel sounds are normal.     Palpations: Abdomen is soft.     Tenderness: There is abdominal tenderness in the  suprapubic area. There is no right CVA tenderness, left CVA tenderness, guarding or rebound.  Musculoskeletal:     Right knee: She exhibits normal range of motion (crepitus), no swelling and no effusion.     Left knee: She exhibits normal range of motion (crepitus), no swelling and no effusion.  Psychiatric:        Attention and Perception: Attention normal.        Mood and Affect: Mood normal.     Wt Readings from Last 3 Encounters:  07/22/18 157 lb 9.6 oz (71.5 kg)  10/22/17 160 lb (72.6 kg)  09/19/17 150 lb (68 kg)    BP 104/78   Pulse (!) 103   Temp 98 F (36.7 C) (Oral)   Ht 5\' 1"  (1.549 m)   Wt 157 lb 9.6 oz (71.5 kg)   SpO2 98%   BMI 29.78 kg/m   Assessment and Plan: 1. Acute cystitis with hematuria Much improved after 24 hours on Macrobid - complete full 5 day course Return if sx recur - POCT Urinalysis Dipstick  2. Chronic pain of both knees Recommend glucosamine supplement Tylenol as needed Regular exercise but avoid excessive strain on knee joints  3. Encounter for screening mammogram for breast cancer Schedule at [facility]; Future   Partially dictated using Editor, commissioning. Any errors are unintentional.  Halina Maidens, MD [facility]  07/22/2018

## 2018-07-22 NOTE — Patient Instructions (Addendum)
Start Glucosamine-chondroitin sulfate supplement. Try it for at least 2 months.  Use Tylenol 500 mg - 2 tabs as needed for knee pain  Monistat vaginal cream for itching if needed

## 2018-07-25 DIAGNOSIS — M5489 Other dorsalgia: Secondary | ICD-10-CM | POA: Diagnosis not present

## 2018-10-01 ENCOUNTER — Encounter: Payer: Self-pay | Admitting: Internal Medicine

## 2018-11-04 ENCOUNTER — Ambulatory Visit
Admission: RE | Admit: 2018-11-04 | Discharge: 2018-11-04 | Disposition: A | Discharge status: Home / Self Care | Payer: BC Managed Care – PPO | Source: Ambulatory Visit | Attending: Internal Medicine | Admitting: Internal Medicine

## 2018-11-04 ENCOUNTER — Other Ambulatory Visit: Payer: Self-pay

## 2018-11-04 DIAGNOSIS — Z1231 Encounter for screening mammogram for malignant neoplasm of breast: Secondary | ICD-10-CM | POA: Insufficient documentation

## 2018-12-16 ENCOUNTER — Encounter: Payer: BLUE CROSS/BLUE SHIELD | Admitting: Internal Medicine

## 2018-12-18 ENCOUNTER — Ambulatory Visit (INDEPENDENT_AMBULATORY_CARE_PROVIDER_SITE_OTHER): Payer: BC Managed Care – PPO | Admitting: Internal Medicine

## 2018-12-18 ENCOUNTER — Other Ambulatory Visit: Payer: Self-pay

## 2018-12-18 ENCOUNTER — Encounter: Payer: Self-pay | Admitting: Internal Medicine

## 2018-12-18 VITALS — BP 116/70 | HR 81 | Ht 61.0 in | Wt 158.0 lb

## 2018-12-18 DIAGNOSIS — Z1211 Encounter for screening for malignant neoplasm of colon: Secondary | ICD-10-CM | POA: Diagnosis not present

## 2018-12-18 DIAGNOSIS — Z0000 Encounter for general adult medical examination without abnormal findings: Secondary | ICD-10-CM

## 2018-12-18 DIAGNOSIS — F39 Unspecified mood [affective] disorder: Secondary | ICD-10-CM | POA: Diagnosis not present

## 2018-12-18 LAB — POCT URINALYSIS DIPSTICK
Bilirubin, UA: NEGATIVE
Blood, UA: NEGATIVE
Glucose, UA: NEGATIVE
Ketones, UA: NEGATIVE
Leukocytes, UA: NEGATIVE
Nitrite, UA: NEGATIVE
Protein, UA: NEGATIVE
Spec Grav, UA: 1.01 (ref 1.010–1.025)
Urobilinogen, UA: 0.2 E.U./dL
pH, UA: 6 (ref 5.0–8.0)

## 2018-12-18 MED ORDER — ESCITALOPRAM OXALATE 10 MG PO TABS: 10.0000 mg | ORAL_TABLET | Freq: Every day | ORAL | 1 refills | Status: DC

## 2018-12-18 NOTE — Progress Notes (Signed)
Date:  12/18/2018   Name:  Isabel Miller   DOB:  20-Dec-1970   MRN:  [id number]   Chief Complaint: Annual Exam (Has OBGYN. Already had flu shot. 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 female who presents today for her Complete Annual Exam. She feels fairly well. She reports exercising very little. She reports she is sleeping fairly well. She denies breast issues.  Mood disorder - she is very stressed over issues with her family and her work.  She has several days when she feels very down and sad.  She denies SI or HI.  She sleeps fairly well.  She is not exercising regularly.  She is not taking any supplements or sleep aids.  Mammogram 10/2018 Colonoscopy - due age 66 Pap s/p hysterectomy  HPI  Lab Results  Component Value Date   CREATININE 0.55 (L) 09/19/2017   BUN 10 09/19/2017   NA 136 09/19/2017   K 4.2 09/19/2017   CL 98 09/19/2017   CO2 21 09/19/2017   No results found for: CHOL, HDL, LDLCALC, LDLDIRECT, TRIG, CHOLHDL Lab Results  Component Value Date   TSH 1.480 09/19/2017   No results found for: HGBA1C   Review of Systems  Constitutional: Negative for chills, fatigue and fever.  HENT: Negative for congestion, hearing loss, tinnitus, trouble swallowing and voice change.   Eyes: Negative for visual disturbance.  Respiratory: Negative for cough, chest tightness, shortness of breath and wheezing.   Cardiovascular: Negative for chest pain, palpitations and leg swelling.  Gastrointestinal: Positive for constipation. Negative for abdominal pain, diarrhea and vomiting.  Endocrine: Negative for polydipsia and polyuria.  Genitourinary: Negative for dysuria, frequency, genital sores, vaginal bleeding and vaginal discharge.  Musculoskeletal: Negative for arthralgias, gait problem and joint swelling.  Skin: Negative for color change and rash.  Neurological:  Positive for headaches. Negative for dizziness, tremors and light-headedness.  Hematological: Negative for adenopathy. Does not bruise/bleed easily.  Psychiatric/Behavioral: Positive for dysphoric mood (up and down mood changes related to multiple stressors). Negative for sleep disturbance and suicidal ideas. The patient is not nervous/anxious.     Patient Active Problem List   Diagnosis Date Noted  . Dependent edema 09/19/2017  . Environmental and seasonal allergies 09/19/2017  . Chronic pain of both knees 09/19/2017  . Rash and nonspecific skin eruption 02/27/2017  . Mood disorder ([place]) 02/27/2017    Allergies  Allergen Reactions  . Ivp Dye [Iodinated Diagnostic Agents] Other (See Comments)    convulsion    Past Surgical History:  Procedure Laterality Date  . CESAREAN SECTION    . CHOLECYSTECTOMY    . HERNIA REPAIR    . PARTIAL HYSTERECTOMY     bleeding  . REDUCTION MAMMAPLASTY Bilateral 2010    Social History   Tobacco Use  . Smoking status: Never Smoker  . Smokeless tobacco: Never Used  Substance Use Topics  . Alcohol use: Yes    Alcohol/week: 1.0 standard drinks    Types: 1 Glasses of wine per week    Comment: occas  . Drug use: No     Medication list has been reviewed and updated.  Current Meds  Medication Sig  . Melatonin 1 MG/4ML LIQD Take by mouth. PRN  . Multiple Vitamins-Minerals (MULTIVITAMIN WITH MINERALS) tablet Take 1 tablet by mouth daily.    PHQ 2/9 Scores 12/18/2018 07/22/2018 02/27/2017  PHQ - 2 Score 4 0 3  PHQ- 9 Score 11 - 4    BP Readings from Last 3 Encounters:  12/18/18 116/70  07/22/18 104/78  10/22/17 94/68    Physical Exam Vitals signs and nursing note reviewed.  Constitutional:      General: She is not in acute distress.    Appearance: She is well-developed.  HENT:     Head: Normocephalic and atraumatic.     Right Ear: Tympanic membrane and ear canal normal.     Left Ear: Tympanic membrane and ear canal normal.      Nose:     Right Sinus: No maxillary sinus tenderness.     Left Sinus: No maxillary sinus tenderness.  Eyes:     General: No scleral icterus.       Right eye: No discharge.        Left eye: No discharge.     Conjunctiva/sclera: Conjunctivae normal.  Neck:     Musculoskeletal: Normal range of motion. No erythema.     Thyroid: No thyromegaly.     Vascular: No carotid bruit.  Cardiovascular:     Rate and Rhythm: Normal rate and regular rhythm.     Pulses: Normal pulses.     Heart sounds: Normal heart sounds.  Pulmonary:     Effort: Pulmonary effort is normal. No respiratory distress.     Breath sounds: No wheezing.  Abdominal:     General: Bowel sounds are normal.     Palpations: Abdomen is soft.     Tenderness: There is no abdominal tenderness.  Musculoskeletal: Normal range of motion.     Right lower leg: No edema.     Left lower leg: No edema.  Lymphadenopathy:     Cervical: No cervical adenopathy.  Skin:    General: Skin is warm and dry.     Capillary Refill: Capillary refill takes less than 2 seconds.     Findings: No rash.  Neurological:     General: No focal deficit present.     Mental Status: She is alert and oriented to person, place, and time.     Cranial Nerves: No cranial nerve deficit.     Sensory: No sensory deficit.     Deep Tendon Reflexes: Reflexes are normal and symmetric.  Psychiatric:        Attention and Perception: Attention normal.        Mood and Affect: Mood normal.        Speech: Speech normal.        Behavior: Behavior normal.        Thought Content: Thought content normal. Thought content does not include suicidal plan.     Wt Readings from Last 3 Encounters:  12/18/18 158 lb (71.7 kg)  07/22/18 157 lb 9.6 oz (71.5 kg)  10/22/17 160 lb (72.6 kg)    BP 116/70   Pulse 81   Ht 5\' 1"  (1.549 m)   Wt 158 lb (71.7 kg)   SpO2 97%   BMI 29.85 kg/m   Assessment and Plan: 1. Annual physical exam Normal exam Increase regular exercise,  increase water intake - CBC with Differential/Platelet - Comprehensive metabolic panel - Lipid panel - TSH - POCT urinalysis dipstick  2. Colon cancer screening - Ambulatory referral to Gastroenterology  3. Mood disorder ([place]) Begin Lexapro - follow up by phone in one month or in person if not improved and then again in January - escitalopram (LEXAPRO) 10 MG tablet; Take 1 tablet (  10 mg total) by mouth daily.  Dispense: 30 tablet; Refill: 1   Partially dictated using Editor, commissioning. Any errors are unintentional.  Halina Maidens, MD [facility]  12/18/2018

## 2018-12-18 NOTE — Patient Instructions (Addendum)
Lexapro - take 1/2 tablet daily for 6 days then increase to one whole tablet daily  Please follow up with a phone call in about 4 weeks to report progress on the new medications   Health Maintenance, Female Adopting a healthy lifestyle and getting preventive care are important in promoting health and wellness. Ask your health care provider about:  The right schedule for you to have regular tests and exams.  Things you can do on your own to prevent diseases and keep yourself healthy. What should I know about diet, weight, and exercise? Eat a healthy diet   Eat a diet that includes plenty of vegetables, fruits, low-fat dairy products, and lean protein.  Do not eat a lot of foods that are high in solid fats, added sugars, or sodium. Maintain a healthy weight Body mass index (BMI) is used to identify weight problems. It estimates body fat based on height and weight. Your health care provider can help determine your BMI and help you achieve or maintain a healthy weight. Get regular exercise Get regular exercise. This is one of the most important things you can do for your health. Most adults should:  Exercise for at least 150 minutes each week. The exercise should increase your heart rate and make you sweat (moderate-intensity exercise).  Do strengthening exercises at least twice a week. This is in addition to the moderate-intensity exercise.  Spend less time sitting. Even light physical activity can be beneficial. Watch cholesterol and blood lipids Have your blood tested for lipids and cholesterol at 50 years of age, then have this test every 5 years. Have your cholesterol levels checked more often if:  Your lipid or cholesterol levels are high.  You are older than 50 years of age.  You are at high risk for heart disease. What should I know about cancer screening? Depending on your health history and family history, you may need to have cancer screening at various ages. This may  include screening for:  Breast cancer.  Cervical cancer.  Colorectal cancer.  Skin cancer.  Lung cancer. What should I know about heart disease, diabetes, and high blood pressure? Blood pressure and heart disease  High blood pressure causes heart disease and increases the risk of stroke. This is more likely to develop in people who have high blood pressure readings, are of African descent, or are overweight.  Have your blood pressure checked: ? Every 3-5 years if you are 87-53 years of age. ? Every year if you are 43 years old or older. Diabetes Have regular diabetes screenings. This checks your fasting blood sugar level. Have the screening done:  Once every three years after age 79 if you are at a normal weight and have a low risk for diabetes.  More often and at a younger age if you are overweight or have a high risk for diabetes. What should I know about preventing infection? Hepatitis B If you have a higher risk for hepatitis B, you should be screened for this virus. Talk with your health care provider to find out if you are at risk for hepatitis B infection. Hepatitis C Testing is recommended for:  Everyone born from [birth year] through 1967.  Anyone with known risk factors for hepatitis C. Sexually transmitted infections (STIs)  Get screened for STIs, including gonorrhea and chlamydia, if: ? You are sexually active and are younger than 50 years of age. ? You are older than 50 years of age and your health care provider tells you  that you are at risk for this type of infection. ? Your sexual activity has changed since you were last screened, and you are at increased risk for chlamydia or gonorrhea. Ask your health care provider if you are at risk.  Ask your health care provider about whether you are at high risk for HIV. Your health care provider may recommend a prescription medicine to help prevent HIV infection. If you choose to take medicine to prevent HIV, you should first  get tested for HIV. You should then be tested every 3 months for as long as you are taking the medicine. Pregnancy  If you are about to stop having your period (premenopausal) and you may become pregnant, seek counseling before you get pregnant.  Take 400 to 800 micrograms (mcg) of folic acid every day if you become pregnant.  Ask for birth control (contraception) if you want to prevent pregnancy. Osteoporosis and menopause Osteoporosis is a disease in which the bones lose minerals and strength with aging. This can result in bone fractures. If you are 62 years old or older, or if you are at risk for osteoporosis and fractures, ask your health care provider if you should:  Be screened for bone loss.  Take a calcium or vitamin D supplement to lower your risk of fractures.  Be given hormone replacement therapy (HRT) to treat symptoms of menopause. Follow these instructions at home: Lifestyle  Do not use any products that contain nicotine or tobacco, such as cigarettes, e-cigarettes, and chewing tobacco. If you need help quitting, ask your health care provider.  Do not use street drugs.  Do not share needles.  Ask your health care provider for help if you need support or information about quitting drugs. Alcohol use  Do not drink alcohol if: ? Your health care provider tells you not to drink. ? You are pregnant, may be pregnant, or are planning to become pregnant.  If you drink alcohol: ? Limit how much you use to 0-1 drink a day. ? Limit intake if you are breastfeeding.  Be aware of how much alcohol is in your drink. In the [place] one drink equals one 12 oz bottle of beer (355 mL), one 5 oz glass of wine (148 mL), or one 1 oz glass of hard liquor (44 mL). General instructions  Schedule regular health, dental, and eye exams.  Stay current with your vaccines.  Tell your health care provider if: ? You often feel depressed. ? You have ever been abused or do not feel safe at  home. Summary  Adopting a healthy lifestyle and getting preventive care are important in promoting health and wellness.  Follow your health care provider's instructions about healthy diet, exercising, and getting tested or screened for diseases.  Follow your health care provider's instructions on monitoring your cholesterol and blood pressure. This information is not intended to replace advice given to you by your health care provider. Make sure you discuss any questions you have with your health care provider. Document Released: 08/08/2010 Document Revised: 01/16/2018 Document Reviewed: 01/16/2018 Elsevier Patient Education  2020 [employer].

## 2018-12-19 LAB — COMPREHENSIVE METABOLIC PANEL
ALT: 26 IU/L (ref 0–32)
AST: 21 IU/L (ref 0–40)
Albumin/Globulin Ratio: 1.6 (ref 1.2–2.2)
Albumin: 4.5 g/dL (ref 3.8–4.8)
Alkaline Phosphatase: 62 IU/L (ref 39–117)
BUN/Creatinine Ratio: 18 (ref 9–23)
BUN: 10 mg/dL (ref 6–24)
Bilirubin Total: 0.3 mg/dL (ref 0.0–1.2)
CO2: 20 mmol/L (ref 20–29)
Calcium: 9.6 mg/dL (ref 8.7–10.2)
Chloride: 102 mmol/L (ref 96–106)
Creatinine, Ser: 0.57 mg/dL (ref 0.57–1.00)
GFR calc Af Amer: 126 mL/min/{1.73_m2} (ref >59)
GFR calc non Af Amer: 109 mL/min/{1.73_m2} (ref >59)
Globulin, Total: 2.9 g/dL (ref 1.5–4.5)
Glucose: 94 mg/dL (ref 65–99)
Potassium: 4.3 mmol/L (ref 3.5–5.2)
Sodium: 136 mmol/L (ref 134–144)
Total Protein: 7.4 g/dL (ref 6.0–8.5)

## 2018-12-19 LAB — CBC WITH DIFFERENTIAL/PLATELET
Basophils Absolute: 0.1 10*3/uL (ref 0.0–0.2)
Basos: 1 %
EOS (ABSOLUTE): 0.1 10*3/uL (ref 0.0–0.4)
Eos: 1 %
Hematocrit: 40.4 % (ref 34.0–46.6)
Hemoglobin: 13.7 g/dL (ref 11.1–15.9)
Immature Grans (Abs): 0.1 10*3/uL (ref 0.0–0.1)
Immature Granulocytes: 1 %
Lymphocytes Absolute: 2.1 10*3/uL (ref 0.7–3.1)
Lymphs: 27 %
MCH: 28.8 pg (ref 26.6–33.0)
MCHC: 33.9 g/dL (ref 31.5–35.7)
MCV: 85 fL (ref 79–97)
Monocytes Absolute: 0.5 10*3/uL (ref 0.1–0.9)
Monocytes: 7 %
Neutrophils Absolute: 5 10*3/uL (ref 1.4–7.0)
Neutrophils: 63 %
Platelets: 309 10*3/uL (ref 150–450)
RBC: 4.76 x10E6/uL (ref 3.77–5.28)
RDW: 13 % (ref 11.7–15.4)
WBC: 7.8 10*3/uL (ref 3.4–10.8)

## 2018-12-19 LAB — LIPID PANEL
Chol/HDL Ratio: 4.5 ratio — ABNORMAL HIGH (ref 0.0–4.4)
Cholesterol, Total: 219 mg/dL — ABNORMAL HIGH (ref 100–199)
HDL: 49 mg/dL (ref >39)
LDL Chol Calc (NIH): 142 mg/dL — ABNORMAL HIGH (ref 0–99)
Triglycerides: 155 mg/dL — ABNORMAL HIGH (ref 0–149)
VLDL Cholesterol Cal: 28 mg/dL (ref 5–40)

## 2018-12-19 LAB — TSH: TSH: 0.768 u[IU]/mL (ref 0.450–4.500)

## 2019-01-07 ENCOUNTER — Encounter: Payer: Self-pay | Admitting: *Deleted

## 2019-01-11 ENCOUNTER — Other Ambulatory Visit: Payer: Self-pay

## 2019-01-11 DIAGNOSIS — Z20822 Contact with and (suspected) exposure to covid-19: Secondary | ICD-10-CM

## 2019-01-14 LAB — NOVEL CORONAVIRUS, NAA: SARS-CoV-2, NAA: NOT DETECTED

## 2019-01-23 ENCOUNTER — Encounter: Payer: Self-pay | Admitting: Internal Medicine

## 2019-01-23 DIAGNOSIS — E782 Mixed hyperlipidemia: Secondary | ICD-10-CM | POA: Insufficient documentation

## 2019-02-12 ENCOUNTER — Ambulatory Visit: Payer: BC Managed Care – PPO | Attending: Internal Medicine

## 2019-02-12 DIAGNOSIS — Z20822 Contact with and (suspected) exposure to covid-19: Secondary | ICD-10-CM

## 2019-02-14 LAB — NOVEL CORONAVIRUS, NAA: SARS-CoV-2, NAA: NOT DETECTED

## 2019-02-17 ENCOUNTER — Ambulatory Visit: Payer: BC Managed Care – PPO | Admitting: Internal Medicine

## 2019-02-21 ENCOUNTER — Ambulatory Visit: Payer: BC Managed Care – PPO | Admitting: Internal Medicine

## 2019-03-10 ENCOUNTER — Encounter: Payer: Self-pay | Admitting: Internal Medicine

## 2019-03-10 ENCOUNTER — Other Ambulatory Visit: Payer: Self-pay

## 2019-03-10 ENCOUNTER — Ambulatory Visit: Payer: BC Managed Care – PPO | Admitting: Internal Medicine

## 2019-03-10 ENCOUNTER — Telehealth: Payer: Self-pay | Admitting: Gastroenterology

## 2019-03-10 VITALS — BP 112/68 | HR 102 | Temp 97.4°F | Ht 61.0 in | Wt 160.0 lb

## 2019-03-10 DIAGNOSIS — N3000 Acute cystitis without hematuria: Secondary | ICD-10-CM | POA: Diagnosis not present

## 2019-03-10 LAB — POC URINALYSIS WITH MICROSCOPIC (NON AUTO)MANUAL RESULT
Bilirubin, UA: NEGATIVE
Epithelial cells, urine per micros: 0
Glucose, UA: NEGATIVE
Ketones, UA: NEGATIVE
Leukocytes, UA: NEGATIVE
Mucus, UA: 0
Nitrite, UA: NEGATIVE
Protein, UA: NEGATIVE
RBC: 0 M/uL — AB (ref 4.04–5.48)
Spec Grav, UA: 1.015 (ref 1.010–1.025)
Urobilinogen, UA: 0.2 E.U./dL
WBC Casts, UA: 2
pH, UA: 7 (ref 5.0–8.0)

## 2019-03-10 MED ORDER — NITROFURANTOIN MONOHYD MACRO 100 MG PO CAPS: 100.0000 mg | ORAL_CAPSULE | Freq: Two times a day (BID) | ORAL | 0 refills | Status: AC

## 2019-03-10 NOTE — Progress Notes (Signed)
Date:  03/10/2019   Name:  Isabel Miller   DOB:  09/10/1970   MRN:  [id number]   Chief Complaint: Urinary Tract Infection (Started yesterday- burning, frequency. Tried AZO for 2 days. Last night she said she was in pain last night and took 2 )  Urinary Tract Infection  This is a new problem. The current episode started yesterday. The problem occurs every urination. The problem has been unchanged. The quality of the pain is described as burning. The pain is mild. There has been no fever. Associated symptoms include frequency and urgency. Pertinent negatives include no chills, hematuria, hesitancy, nausea or vomiting. Treatments tried: nitrofurantoin from an old Rx. The treatment provided mild relief.    Lab Results  Component Value Date   CREATININE 0.57 12/18/2018   BUN 10 12/18/2018   NA 136 12/18/2018   K 4.3 12/18/2018   CL 102 12/18/2018   CO2 20 12/18/2018   Lab Results  Component Value Date   CHOL 219 (H) 12/18/2018   HDL 49 12/18/2018   LDLCALC 142 (H) 12/18/2018   TRIG 155 (H) 12/18/2018   CHOLHDL 4.5 (H) 12/18/2018   Lab Results  Component Value Date   TSH 0.768 12/18/2018   No results found for: HGBA1C   Review of Systems  Constitutional: Negative for chills, fatigue and fever.  Respiratory: Negative for cough, chest tightness and shortness of breath.   Cardiovascular: Negative for chest pain and palpitations.  Gastrointestinal: Negative for abdominal pain, constipation, diarrhea, nausea and vomiting.  Genitourinary: Positive for frequency and urgency. Negative for hematuria and hesitancy.  Neurological: Negative for dizziness and headaches.    Patient Active Problem List   Diagnosis Date Noted  . Mixed hyperlipidemia 01/23/2019  . Dependent edema 09/19/2017  . Environmental and seasonal allergies 09/19/2017  . Chronic pain of both knees 09/19/2017  . Rash and nonspecific skin eruption 02/27/2017  . Mood disorder ([place]) 02/27/2017     Allergies  Allergen Reactions  . Ivp Dye [Iodinated Diagnostic Agents] Other (See Comments)    convulsion    Past Surgical History:  Procedure Laterality Date  . CESAREAN SECTION    . CHOLECYSTECTOMY    . HERNIA REPAIR    . PARTIAL HYSTERECTOMY     bleeding  . REDUCTION MAMMAPLASTY Bilateral 2010    Social History   Tobacco Use  . Smoking status: Never Smoker  . Smokeless tobacco: Never Used  Substance Use Topics  . Alcohol use: Yes    Alcohol/week: 1.0 standard drinks    Types: 1 Glasses of wine per week    Comment: occas  . Drug use: No     Medication list has been reviewed and updated.  Current Meds  Medication Sig  . escitalopram (LEXAPRO) 10 MG tablet Take 1 tablet (10 mg total) by mouth daily.  . Melatonin 1 MG/4ML LIQD Take by mouth. PRN  . Multiple Vitamins-Minerals (MULTIVITAMIN WITH MINERALS) tablet Take 1 tablet by mouth daily.    PHQ 2/9 Scores 03/10/2019 12/18/2018 07/22/2018 02/27/2017  PHQ - 2 Score 2 4 0 3  PHQ- 9 Score 8 11 - 4    BP Readings from Last 3 Encounters:  12/18/18 116/70  07/22/18 104/78  10/22/17 94/68    Physical Exam Vitals and nursing note reviewed.  Constitutional:      Appearance: Normal appearance. She is well-developed.  Cardiovascular:     Rate and Rhythm: Normal rate and regular rhythm.     Heart  sounds: Normal heart sounds.  Pulmonary:     Effort: Pulmonary effort is normal. No respiratory distress.     Breath sounds: Normal breath sounds.  Abdominal:     General: Bowel sounds are normal.     Palpations: Abdomen is soft.     Tenderness: There is abdominal tenderness in the suprapubic area. There is no guarding or rebound.  Neurological:     Mental Status: She is alert.     Wt Readings from Last 3 Encounters:  03/10/19 160 lb (72.6 kg)  12/18/18 158 lb (71.7 kg)  07/22/18 157 lb 9.6 oz (71.5 kg)    Pulse (!) 102   Temp (!) 97.4 F (36.3 C) (Temporal)   Ht 5\' 1"  (1.549 m)   Wt 160 lb (72.6 kg)    SpO2 97%   BMI 30.23 kg/m   Assessment and Plan: 1. Acute cystitis without hematuria Continue to push fluids Note given to work from home tomorrow - POC urinalysis w microscopic (non auto) - nitrofurantoin, macrocrystal-monohydrate, (MACROBID) 100 MG capsule; Take 1 capsule (100 mg total) by mouth 2 (two) times daily for 5 days.  Dispense: 10 capsule; Refill: 0   Partially dictated using Editor, commissioning. Any errors are unintentional.  Halina Maidens, MD [facility]  03/10/2019

## 2019-03-10 NOTE — Telephone Encounter (Signed)
Pt left vm she received a letter to schedule a colonoscopy

## 2019-03-11 NOTE — Telephone Encounter (Signed)
Returned patients call to schedule her colonoscopy.  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 She has been provided [employer] number to call and update insurance information.  I will email her the diagnosis and procedure code to discuss billing with her insurance.  Thanks,  [place], [place]

## 2019-04-02 ENCOUNTER — Encounter: Payer: Self-pay | Admitting: *Deleted

## 2019-04-06 ENCOUNTER — Ambulatory Visit: Payer: BC Managed Care – PPO | Attending: Internal Medicine

## 2019-04-06 DIAGNOSIS — Z23 Encounter for immunization: Secondary | ICD-10-CM | POA: Insufficient documentation

## 2019-04-06 NOTE — Progress Notes (Signed)
   Covid-19 Vaccination Clinic  Name:  Isabel Miller    MRN: [id number] DOB: [birth year]  04/06/2019  Ms. Isabel Miller was observed post Covid-19 immunization for 15 minutes without incidence. She was provided with Vaccine Information Sheet and instruction to access the V-Safe system.   Ms. Isabel Miller was instructed to call 911 with any severe reactions post vaccine: Marland Kitchen Difficulty breathing  . Swelling of your face and throat  . A fast heartbeat  . A bad rash all over your body  . Dizziness and weakness    Immunizations Administered    Name Date Dose VIS Date Route   Pfizer COVID-19 Vaccine 04/06/2019  9:06 AM 0.3 mL 01/17/2019 Intramuscular   Manufacturer: [place]   Lot: [id number]   [place]: [id number]

## 2019-04-28 ENCOUNTER — Ambulatory Visit: Payer: BC Managed Care – PPO | Attending: Internal Medicine

## 2019-04-28 DIAGNOSIS — Z23 Encounter for immunization: Secondary | ICD-10-CM

## 2019-04-28 NOTE — Progress Notes (Signed)
   Covid-19 Vaccination Clinic  Name:  Isabel Miller    MRN: [id number] DOB: 03-01-1970  04/28/2019  [name] was observed post Covid-19 immunization for 15 minutes without incident. She was provided with Vaccine Information Sheet and instruction to access the V-Safe system.   [name] was instructed to call 911 with any severe reactions post vaccine: Marland Kitchen Difficulty breathing  . Swelling of face and throat  . A fast heartbeat  . A bad rash all over body  . Dizziness and weakness   Immunizations Administered    Name Date Dose VIS Date Route   Pfizer COVID-19 Vaccine 04/28/2019  8:52 AM 0.3 mL 01/17/2019 Intramuscular   Manufacturer: [place]   Lot: [id number]   [place]: [id number]

## 2019-05-05 ENCOUNTER — Telehealth: Payer: Self-pay

## 2019-05-05 NOTE — Telephone Encounter (Signed)
Left message for patient notifying her that we are changing her colon consult to via phone. 3:30pm on 05/06/19

## 2019-05-06 ENCOUNTER — Ambulatory Visit (INDEPENDENT_AMBULATORY_CARE_PROVIDER_SITE_OTHER): Payer: Self-pay | Admitting: Gastroenterology

## 2019-05-06 DIAGNOSIS — Z1211 Encounter for screening for malignant neoplasm of colon: Secondary | ICD-10-CM

## 2019-05-06 MED ORDER — PEG 3350-KCL-NA BICARB-NACL 420 G PO SOLR: 4000.0000 mL | Freq: Once | ORAL | 0 refills | Status: AC

## 2019-05-06 NOTE — Progress Notes (Signed)
Gastroenterology Pre-Procedure Review  Request Date: Friday 05/23/19 Requesting Physician: Dr. Vicente Males  PATIENT REVIEW QUESTIONS: The patient responded to the following health history questions as indicated:    1. Are you having any GI issues? no 2. Do you have a personal history of Polyps? no 3. Do you have a family history of Colon Cancer or Polyps? no 4. Diabetes Mellitus? no 5. Joint replacements in the past 12 months?no 6. Major health problems in the past 3 months?no 7. Any artificial heart valves, MVP, or defibrillator?no    MEDICATIONS & ALLERGIES:    Patient reports the following regarding taking any anticoagulation/antiplatelet therapy:   Plavix, Coumadin, Eliquis, Xarelto, Lovenox, Pradaxa, Brilinta, or Effient? no Aspirin? no  Patient confirms/reports the following medications:  Current Outpatient Medications  Medication Sig Dispense Refill  . escitalopram (LEXAPRO) 10 MG tablet Take 1 tablet (10 mg total) by mouth daily. 30 tablet 1  . Melatonin 1 MG/4ML LIQD Take by mouth. PRN    . Multiple Vitamins-Minerals (MULTIVITAMIN WITH MINERALS) tablet Take 1 tablet by mouth daily.    . polyethylene glycol-electrolytes (NULYTELY) 420 g solution Take 4,000 mLs by mouth once for 1 dose. 4000 mL 0   No current facility-administered medications for this visit.    Patient confirms/reports the following allergies:  Allergies  Allergen Reactions  . Ivp Dye [Iodinated Diagnostic Agents] Other (See Comments)    convulsion    Orders Placed This Encounter  Procedures  . Procedural/ Surgical Case Request: COLONOSCOPY WITH PROPOFOL    Standing Status:   Standing    Number of Occurrences:   1    Order Specific Question:   Pre-op diagnosis    Answer:   screening colonoscopy    Order Specific Question:   CPT Code    Answer:   [id number]    AUTHORIZATION INFORMATION Primary Insurance: 1D#: Group #:  Secondary Insurance: 1D#: Group #:  SCHEDULE INFORMATION: Date:  05/23/19 Time: Location:[facility]

## 2019-05-21 ENCOUNTER — Other Ambulatory Visit
Admission: RE | Admit: 2019-05-21 | Discharge: 2019-05-21 | Disposition: A | Discharge status: Home / Self Care | Payer: BC Managed Care – PPO | Source: Ambulatory Visit | Attending: Gastroenterology | Admitting: Gastroenterology

## 2019-05-21 ENCOUNTER — Other Ambulatory Visit: Payer: Self-pay

## 2019-05-21 DIAGNOSIS — Z20822 Contact with and (suspected) exposure to covid-19: Secondary | ICD-10-CM | POA: Diagnosis not present

## 2019-05-21 DIAGNOSIS — Z01812 Encounter for preprocedural laboratory examination: Secondary | ICD-10-CM | POA: Insufficient documentation

## 2019-05-21 LAB — SARS CORONAVIRUS 2 (TAT 6-24 HRS): SARS Coronavirus 2: NEGATIVE

## 2019-05-22 ENCOUNTER — Encounter: Payer: Self-pay | Admitting: Gastroenterology

## 2019-05-23 ENCOUNTER — Ambulatory Visit: Payer: BC Managed Care – PPO | Admitting: Anesthesiology

## 2019-05-23 ENCOUNTER — Encounter: Payer: Self-pay | Admitting: Gastroenterology

## 2019-05-23 ENCOUNTER — Encounter
Admission: RE | Disposition: A | Discharge status: Home / Self Care | Payer: Self-pay | Source: Home / Self Care | Attending: Gastroenterology

## 2019-05-23 ENCOUNTER — Ambulatory Visit
Admission: RE | Admit: 2019-05-23 | Discharge: 2019-05-23 | Disposition: A | Discharge status: Home / Self Care | Payer: BC Managed Care – PPO | Attending: Gastroenterology | Admitting: Gastroenterology

## 2019-05-23 DIAGNOSIS — Z1211 Encounter for screening for malignant neoplasm of colon: Secondary | ICD-10-CM | POA: Insufficient documentation

## 2019-05-23 DIAGNOSIS — K635 Polyp of colon: Secondary | ICD-10-CM | POA: Diagnosis not present

## 2019-05-23 DIAGNOSIS — Z79899 Other long term (current) drug therapy: Secondary | ICD-10-CM | POA: Diagnosis not present

## 2019-05-23 DIAGNOSIS — Z91041 Radiographic dye allergy status: Secondary | ICD-10-CM | POA: Insufficient documentation

## 2019-05-23 DIAGNOSIS — D124 Benign neoplasm of descending colon: Secondary | ICD-10-CM | POA: Insufficient documentation

## 2019-05-23 HISTORY — PX: COLONOSCOPY WITH PROPOFOL: SHX5780

## 2019-05-23 SURGERY — COLONOSCOPY WITH PROPOFOL
Anesthesia: General

## 2019-05-23 MED ORDER — PROPOFOL 10 MG/ML IV BOLUS
INTRAVENOUS | Status: DC | PRN
  Administered 2019-05-23: 50 mg via INTRAVENOUS
  Administered 2019-05-23: 10 mg via INTRAVENOUS

## 2019-05-23 MED ORDER — PROPOFOL 500 MG/50ML IV EMUL
INTRAVENOUS | Status: DC | PRN
  Administered 2019-05-23: 165 ug/kg/min via INTRAVENOUS

## 2019-05-23 MED ORDER — SODIUM CHLORIDE 0.9 % IV SOLN
INTRAVENOUS | Status: DC
  Administered 2019-05-23: 08:00:00 1000 mL via INTRAVENOUS

## 2019-05-23 MED ORDER — LIDOCAINE HCL (CARDIAC) PF 100 MG/5ML IV SOSY
PREFILLED_SYRINGE | INTRAVENOUS | Status: DC | PRN
  Administered 2019-05-23: 100 mg via INTRATRACHEAL

## 2019-05-23 NOTE — Op Note (Signed)
[facility] Gastroenterology Patient Name: Isabel Miller Procedure Date: 05/23/2019 8:38 AM MRN: [id number] Account #: [id number] Date of Birth: [birth year] Admit Type: Outpatient Age: 49 Room: [facility] ENDO ROOM 3 Gender: Female Note Status: Finalized Procedure:             Colonoscopy Indications:           Screening for colorectal malignant neoplasm Providers:             Jonathon Bellows MD, MD Medicines:             Monitored Anesthesia Care Complications:         No immediate complications. Procedure:             Pre-Anesthesia Assessment:                        - Prior to the procedure, a History and Physical was                         performed, and patient medications, allergies and                         sensitivities were reviewed. The patient's tolerance                         of previous anesthesia was reviewed.                        - The risks and benefits of the procedure and the                         sedation options and risks were discussed with the                         patient. All questions were answered and informed                         consent was obtained.                        - ASA Grade Assessment: II - A patient with mild                         systemic disease.                        After obtaining informed consent, the colonoscope was                         passed under direct vision. Throughout the procedure,                         the patient's blood pressure, pulse, and oxygen                         saturations were monitored continuously. The                         Colonoscope was introduced through the anus and  advanced to the the cecum, identified by the                         appendiceal orifice. The colonoscopy was performed                         with ease. The patient tolerated the procedure well. Findings:      The perianal and digital rectal examinations were normal.      A 5 mm  polyp was found in the descending colon. The polyp was sessile.       The polyp was removed with a cold snare. Resection and retrieval were       complete.      The exam was otherwise without abnormality on direct and retroflexion       views. Impression:            - One 5 mm polyp in the descending colon, removed with                         a cold snare. Resected and retrieved.                        - The examination was otherwise normal on direct and                         retroflexion views. Recommendation:        - Discharge patient to home (with escort).                        - Resume previous diet.                        - Continue present medications.                        - Await pathology results.                        - Repeat colonoscopy for surveillance based on                         pathology results. Procedure Code(s):     --- Professional ---                        [phone], Colonoscopy, flexible; with removal of                         tumor(s), polyp(s), or other lesion(s) by snare                         technique Diagnosis Code(s):     --- Professional ---                        Z12.11, Encounter for screening for malignant neoplasm                         of colon                        K63.5, Polyp of colon  CPT copyright 2019 American Medical Association. All rights reserved. The codes documented in this report are preliminary and upon coder review may  be revised to meet current compliance requirements. Jonathon Bellows, MD Jonathon Bellows MD, MD 05/23/2019 9:14:30 AM This report has been signed electronically. Number of Addenda: 0 Note Initiated On: 05/23/2019 8:38 AM Scope Withdrawal Time: 0 hours 14 minutes 59 seconds  Total Procedure Duration: 0 hours 18 minutes 16 seconds  Estimated Blood Loss:  Estimated blood loss: none.      [facility]

## 2019-05-23 NOTE — Anesthesia Preprocedure Evaluation (Addendum)
Anesthesia Evaluation  Patient identified by MRN, date of birth, ID band Patient awake    Reviewed: Allergy & Precautions, NPO status , Patient's Chart, lab work & pertinent test results  History of Anesthesia Complications Negative for: history of anesthetic complications  Airway Mallampati: III       Dental   Pulmonary neg sleep apnea, neg COPD, Not current smoker,           Cardiovascular (-) hypertension(-) Past MI and (-) CHF (-) dysrhythmias (-) Valvular Problems/Murmurs     Neuro/Psych neg Seizures    GI/Hepatic Neg liver ROS, neg GERD  ,  Endo/Other  neg diabetes  Renal/GU negative Renal ROS     Musculoskeletal   Abdominal   Peds  Hematology   Anesthesia Other Findings   Reproductive/Obstetrics                             Anesthesia Physical Anesthesia Plan  ASA: I  Anesthesia Plan: General   Post-op Pain Management:    Induction: Intravenous  PONV Risk Score and Plan: 3 and Propofol infusion and TIVA  Airway Management Planned: Nasal Cannula  Additional Equipment:   Intra-op Plan:   Post-operative Plan:   Informed Consent: I have reviewed the patients History and Physical, chart, labs and discussed the procedure including the risks, benefits and alternatives for the proposed anesthesia with the patient or authorized representative who has indicated his/her understanding and acceptance.       Plan Discussed with:   Anesthesia Plan Comments:         Anesthesia Quick Evaluation

## 2019-05-23 NOTE — H&P (Signed)
Jonathon Bellows, MD [address] [place], [place], [place], [address] [address], [place], [place], [place], [address] Phone: [phone]  Fax: [phone]  Primary Care Physician:  Glean Hess, MD   Pre-Procedure History & Physical: HPI:  Isabel Miller is a 49 y.o. female is here for an colonoscopy.   Past Medical History:  Diagnosis Date  . Heavy menses     Past Surgical History:  Procedure Laterality Date  . CESAREAN SECTION    . CHOLECYSTECTOMY    . HERNIA REPAIR    . PARTIAL HYSTERECTOMY     bleeding  . REDUCTION MAMMAPLASTY Bilateral 2010    Prior to Admission medications   Medication Sig Start Date End Date Taking? Authorizing Provider  escitalopram (LEXAPRO) 10 MG tablet Take 1 tablet (10 mg total) by mouth daily. 12/18/18   Glean Hess, MD  Melatonin 1 MG/4ML LIQD Take by mouth. PRN    [provider]  Multiple Vitamins-Minerals (MULTIVITAMIN WITH MINERALS) tablet Take 1 tablet by mouth daily.    [provider]    Allergies as of 05/07/2019 - Review Complete 05/06/2019  Allergen Reaction Noted  . Ivp dye [iodinated diagnostic agents] Other (See Comments) 02/07/2017    Family History  Problem Relation Age of Onset  . Hypertension Father   . Breast cancer Neg Hx   . Ovarian cancer Neg Hx   . Colon cancer Neg Hx   . Diabetes Neg Hx     Social History   Socioeconomic History  . Marital status: Divorced    Spouse name: Not on file  . Number of children: 1  . Years of education: Not on file  . Highest education level: Not on file  Occupational History  . Occupation: Merchant navy officer    Comment: from [place]  Tobacco Use  . Smoking status: Never Smoker  . Smokeless tobacco: Never Used  Substance and Sexual Activity  . Alcohol use: Yes    Alcohol/week: 1.0 standard drinks    Types: 1 Glasses of wine per week    Comment: occas  . Drug use: No  . Sexual activity: Not Currently  Other Topics  Concern  . 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 Difficulty of Paying Living Expenses:   Food Insecurity:   . Worried About Charity fundraiser in the Last Year:   . Arboriculturist in the Last Year:   Transportation Needs:   . Film/video editor (Medical):   Marland Kitchen Lack of Transportation (Non-Medical):   Physical Activity:   . Days of Exercise per Week:   . Minutes of Exercise per Session:   Stress:   . Feeling of Stress :   Social Connections:   . Frequency of Communication with Friends and Family:   . Frequency of Social Gatherings with Friends and Family:   . Attends Religious Services:   . Active Member of Clubs or Organizations:   . Attends Archivist Meetings:   Marland Kitchen Marital Status:   Intimate Partner Violence:   . Fear of Current or Ex-Partner:   . Emotionally Abused:   Marland Kitchen Physically Abused:   . Sexually Abused:     Review of Systems: See HPI, otherwise negative ROS  Physical Exam: BP 124/81   Pulse 78   Temp Marland Kitchen)  97 F (36.1 C) (Tympanic)   Resp 18   Ht 5\' 2"  (1.575 m)   Wt 68 kg   SpO2 99%   BMI 27.44 kg/m  General:   Alert,  pleasant and cooperative in NAD Head:  Normocephalic and atraumatic. Neck:  Supple; no masses or thyromegaly. Lungs:  Clear throughout to auscultation, normal respiratory effort.    Heart:  +S1, +S2, Regular rate and rhythm, No edema. Abdomen:  Soft, nontender and nondistended. Normal bowel sounds, without guarding, and without rebound.   Neurologic:  Alert and  oriented x4;  grossly normal neurologically.  Impression/Plan: [name] is here for an colonoscopy to be performed for Screening colonoscopy average risk   Risks, benefits, limitations, and alternatives regarding  colonoscopy have been reviewed with the patient.  Questions have been answered.  All parties agreeable.   Jonathon Bellows, MD  05/23/2019, 8:37 AM

## 2019-05-23 NOTE — Anesthesia Postprocedure Evaluation (Signed)
Anesthesia Post Note  Patient: Isabel Miller  Procedure(s) Performed: COLONOSCOPY WITH PROPOFOL (N/A )  Patient location during evaluation: Endoscopy Anesthesia Type: General Level of consciousness: awake and alert Pain management: pain level controlled Vital Signs Assessment: post-procedure vital signs reviewed and stable Respiratory status: spontaneous breathing and respiratory function stable Cardiovascular status: stable Anesthetic complications: no     Last Vitals:  Vitals:   05/23/19 0926 05/23/19 0936  BP: 114/79 116/79  Pulse: 73 68  Resp: 16 17  Temp:    SpO2: 100% 100%    Last Pain:  Vitals:   05/23/19 0936  TempSrc:   PainSc: 0-No pain                 Geni Skorupski K

## 2019-05-23 NOTE — Transfer of Care (Signed)
Immediate Anesthesia Transfer of Care Note  Patient: Isabel Miller  Procedure(s) Performed: COLONOSCOPY WITH PROPOFOL (N/A )  Patient Location: Endoscopy Unit  Anesthesia Type:General  Level of Consciousness: drowsy, patient cooperative and responds to stimulation  Airway & Oxygen Therapy: Patient Spontanous Breathing and Patient connected to face mask oxygen  Post-op Assessment: Report given to RN and Post -op Vital signs reviewed and stable  Post vital signs: Reviewed and stable  Last Vitals:  Vitals Value Taken Time  BP 111/76 05/23/19 0915  Temp    Pulse 77 05/23/19 0917  Resp 14 05/23/19 0917  SpO2 99 % 05/23/19 0917  Vitals shown include unvalidated device data.  Last Pain:  Vitals:   05/23/19 0756  TempSrc: Tympanic  PainSc: 0-No pain         Complications: No apparent anesthesia complications

## 2019-05-26 ENCOUNTER — Encounter: Payer: Self-pay | Admitting: *Deleted

## 2019-05-26 ENCOUNTER — Encounter: Payer: Self-pay | Admitting: Gastroenterology

## 2019-05-26 LAB — SURGICAL PATHOLOGY

## 2019-08-13 DIAGNOSIS — M172 Bilateral post-traumatic osteoarthritis of knee: Secondary | ICD-10-CM | POA: Diagnosis not present

## 2019-08-13 DIAGNOSIS — M170 Bilateral primary osteoarthritis of knee: Secondary | ICD-10-CM | POA: Diagnosis not present

## 2019-10-10 ENCOUNTER — Telehealth: Payer: Self-pay | Admitting: Internal Medicine

## 2019-10-10 NOTE — Telephone Encounter (Unsigned)
Copied from [place] [id number]. Topic: General - Other >> Oct 10, 2019  4:12 PM Alanda Slim E wrote: Reason for CRM: Pt states for a while her ankles have been swollen and she is retaining liquid / Pt would like to know if Dr. Army Melia can treat her for this or does she need to get a referral to a specialist/ Pt is not able to take many days off work to attend several appts / please advise asap

## 2019-10-14 NOTE — Telephone Encounter (Signed)
Called pt and left a message.

## 2019-10-16 ENCOUNTER — Telehealth: Payer: Self-pay | Admitting: Internal Medicine

## 2019-10-16 NOTE — Telephone Encounter (Signed)
Patient is calling to ask if she could possible be seen sooner for an earlier appt for L & R ankles swelling. First available appt 11/03/19 Please advise [phone]

## 2019-10-17 NOTE — Telephone Encounter (Signed)
Pt called stating that she would like to be seen by someone sooner than the 27th. She is requesting to know if she can be seen by another doctor in the office instead. Please advise.

## 2019-10-17 NOTE — Telephone Encounter (Signed)
Called pt told her that we did not have any available appts. sooner than 9/27. To drink plenty of fluids and also prop her feet up. If swelling gets bad to go to UC. Pt verbalized understanding.  KP

## 2019-11-03 ENCOUNTER — Encounter: Payer: Self-pay | Admitting: Internal Medicine

## 2019-11-03 ENCOUNTER — Other Ambulatory Visit: Payer: Self-pay

## 2019-11-03 ENCOUNTER — Ambulatory Visit: Payer: BC Managed Care – PPO | Admitting: Internal Medicine

## 2019-11-03 VITALS — BP 124/84 | HR 98 | Temp 98.5°F | Ht 62.0 in | Wt 163.0 lb

## 2019-11-03 DIAGNOSIS — F39 Unspecified mood [affective] disorder: Secondary | ICD-10-CM | POA: Diagnosis not present

## 2019-11-03 DIAGNOSIS — R600 Localized edema: Secondary | ICD-10-CM | POA: Insufficient documentation

## 2019-11-03 DIAGNOSIS — L309 Dermatitis, unspecified: Secondary | ICD-10-CM | POA: Insufficient documentation

## 2019-11-03 DIAGNOSIS — L308 Other specified dermatitis: Secondary | ICD-10-CM | POA: Diagnosis not present

## 2019-11-03 DIAGNOSIS — K644 Residual hemorrhoidal skin tags: Secondary | ICD-10-CM | POA: Insufficient documentation

## 2019-11-03 HISTORY — DX: Residual hemorrhoidal skin tags: K64.4

## 2019-11-03 MED ORDER — HYDROCORTISONE ACE-PRAMOXINE 1-1 % EX CREA: 1.0000 "application " | TOPICAL_CREAM | Freq: Two times a day (BID) | CUTANEOUS | 0 refills | Status: DC

## 2019-11-03 NOTE — Patient Instructions (Addendum)
Lexapro (escitalopram) 10mg   - take 1/2 daily or 5 mg.  Elevate legs when able Limit salt intake Increase water intake Exercise regularly

## 2019-11-03 NOTE — Progress Notes (Signed)
Date:  11/03/2019   Name:  Isabel Miller   DOB:  13-Sep-1970   MRN:  [id number]   Chief Complaint: Joint Swelling (X2 months, both ankles, helps a little when feet are elevated) and Hemorrhoids (X1 year, bleeding, uncomfortable, cream doesnt help)  Rectal Bleeding  The current episode started more than 2 weeks ago. The problem occurs occasionally. The problem has been unchanged. The patient is experiencing no pain. The stool is described as soft. There was no prior successful therapy. There was no prior unsuccessful therapy. Associated symptoms include hemorrhoids (soft tags) and rash. Pertinent negatives include no abdominal pain, no diarrhea, no hematemesis, no rectal pain, no chest pain, no headaches and no coughing.  Rash This is a new problem. The problem is unchanged. The affected locations include the neck. The rash is characterized by itchiness and dryness. She was exposed to nothing. Associated symptoms include fatigue. Pertinent negatives include no cough, diarrhea or shortness of breath. Past treatments include nothing.  Depression        This is a chronic problem.The problem is unchanged.  Associated symptoms include fatigue, hopelessness, decreased interest and sad.  Associated symptoms include no decreased concentration, no appetite change, no body aches, no headaches and no suicidal ideas.     The symptoms are aggravated by social issues.  Past treatments include nothing (was prescribed Lexapro but never tried it). Edema - noted several months ago in feet and lateral ankles.  It comes and goes and is mild.  No pain or redness.  Improves with elevation.  No chest pain, shortness of breath, recent weight gain.  She uses salt to cook and adds it to her food.  She does not drink much water.  She recently started doing Yoga with some benefit.  No recent prolonged immobilization.  Lab Results  Component Value Date   CREATININE 0.57 12/18/2018   BUN 10 12/18/2018   NA  136 12/18/2018   K 4.3 12/18/2018   CL 102 12/18/2018   CO2 20 12/18/2018   Lab Results  Component Value Date   CHOL 219 (H) 12/18/2018   HDL 49 12/18/2018   LDLCALC 142 (H) 12/18/2018   TRIG 155 (H) 12/18/2018   CHOLHDL 4.5 (H) 12/18/2018   Lab Results  Component Value Date   TSH 0.768 12/18/2018   No results found for: HGBA1C Lab Results  Component Value Date   WBC 7.8 12/18/2018   HGB 13.7 12/18/2018   HCT 40.4 12/18/2018   MCV 85 12/18/2018   PLT 309 12/18/2018   Lab Results  Component Value Date   ALT 26 12/18/2018   AST 21 12/18/2018   ALKPHOS 62 12/18/2018   BILITOT 0.3 12/18/2018     Review of Systems  Constitutional: Positive for fatigue. Negative for appetite change, chills and unexpected weight change.  Respiratory: Negative for cough, chest tightness, shortness of breath and wheezing.   Cardiovascular: Positive for leg swelling. Negative for chest pain and palpitations.  Gastrointestinal: Positive for anal bleeding, hematochezia and hemorrhoids (soft tags). Negative for abdominal pain, constipation, diarrhea, hematemesis and rectal pain.  Musculoskeletal: Negative for arthralgias, gait problem and joint swelling.  Skin: Positive for rash.  Neurological: Negative for dizziness, light-headedness and headaches.  Psychiatric/Behavioral: Positive for depression and dysphoric mood. Negative for decreased concentration, sleep disturbance and suicidal ideas. The patient is not nervous/anxious.     Patient Active Problem List   Diagnosis Date Noted  . Eczema 11/03/2019  . Edema of  both lower legs 11/03/2019  . Bleeding external hemorrhoids 11/03/2019  . Mixed hyperlipidemia 01/23/2019  . Dependent edema 09/19/2017  . Environmental and seasonal allergies 09/19/2017  . Chronic pain of both knees 09/19/2017  . Rash and nonspecific skin eruption 02/27/2017  . Mood disorder ([place]) 02/27/2017    Allergies  Allergen Reactions  . Ivp Dye [Iodinated Diagnostic  Agents] Other (See Comments)    convulsion    Past Surgical History:  Procedure Laterality Date  . CESAREAN SECTION    . CHOLECYSTECTOMY    . COLONOSCOPY WITH PROPOFOL N/A 05/23/2019   Procedure: COLONOSCOPY WITH PROPOFOL;  Surgeon: Jonathon Bellows, MD;  Location: [facility] ENDOSCOPY;  Service: Gastroenterology;  Laterality: N/A;  . HERNIA REPAIR    . PARTIAL HYSTERECTOMY     bleeding  . REDUCTION MAMMAPLASTY Bilateral 2010    Social History   Tobacco Use  . Smoking status: Never Smoker  . Smokeless tobacco: Never Used  Vaping Use  . Vaping Use: Never used  Substance Use Topics  . Alcohol use: Yes    Alcohol/week: 1.0 standard drink    Types: 1 Glasses of wine per week    Comment: occas  . Drug use: No     Medication list has been reviewed and updated.  Current Meds  Medication Sig  . Glucosamine HCl (GLUCOSAMINE PO) Take 1 tablet by mouth 3 (three) times a week.  . Multiple Vitamins-Minerals (MULTIVITAMIN WITH MINERALS) tablet Take 1 tablet by mouth daily.  . Multiple Vitamins-Minerals (ZINC PO) Take by mouth.    PHQ 2/9 Scores 11/03/2019 03/10/2019 12/18/2018 07/22/2018  PHQ - 2 Score 3 2 4  0  PHQ- 9 Score 7 8 11  -    No flowsheet data found.  BP Readings from Last 3 Encounters:  11/03/19 124/84  05/23/19 122/82  03/10/19 112/68    Physical Exam Vitals and nursing note reviewed.  Constitutional:      General: She is not in acute distress.    Appearance: Normal appearance. She is well-developed.  HENT:     Head: Normocephalic and atraumatic.  Neck:     Vascular: No carotid bruit.  Cardiovascular:     Rate and Rhythm: Normal rate and regular rhythm.  No extrasystoles are present.    Pulses: Normal pulses.          Dorsalis pedis pulses are 2+ on the right side and 2+ on the left side.       Posterior tibial pulses are 2+ on the right side and 2+ on the left side.     Heart sounds: Normal heart sounds. No murmur heard.   Pulmonary:     Effort: Pulmonary effort  is normal. No respiratory distress.     Breath sounds: No wheezing or rhonchi.  Musculoskeletal:        General: Normal range of motion.     Cervical back: Normal range of motion.     Right ankle: Swelling (minimal swelling lateral ankle without pitting) present.     Left ankle: Swelling (mild soft tissues swelling lateral ankles without pitting) present.     Comments: Mild pitting at lower leg bilaterally No calf tenderness or Homan's sign No cord or warmth  Lymphadenopathy:     Cervical: No cervical adenopathy.  Skin:    General: Skin is warm and dry.     Findings: No rash.          Comments: Linear lesion with slight hyperpigmentation and skin thickening  Neurological:  Mental Status: She is alert and oriented to person, place, and time.  Psychiatric:        Attention and Perception: Attention normal.        Mood and Affect: Mood is depressed.     Wt Readings from Last 3 Encounters:  11/03/19 163 lb (73.9 kg)  05/23/19 150 lb (68 kg)  03/10/19 160 lb (72.6 kg)    BP 124/84 (BP Location: Right Arm, Patient Position: Sitting)   Pulse 98   Temp 98.5 F (36.9 C) (Oral)   Ht 5\' 2"  (1.575 m)   Wt 163 lb (73.9 kg)   SpO2 98%   BMI 29.81 kg/m   Assessment and Plan: 1. Bleeding external hemorrhoids Stop over the counter creams Use proctocream bid If no improvement, can refer - pramoxine-hydrocortisone (PROCTOCREAM-HC) 1-1 % rectal cream; Place 1 application rectally 2 (two) times daily.  Dispense: 30 g; Refill: 0  2. Edema of both lower legs No obvious pathology - suspect mild venous insufficiency Continue elevation, exercise, and begin sodium restriction and increase water intake Will obtain labs to rule out metabolic contribution - Comprehensive metabolic panel - TSH + free T4 - CBC with Differential/Platelet  3. Other eczema Use otc cortisone cream daily   4. Mood disorder (HCC) Causing moderate distress and interfering with daily activities Pt has  previous Rx of Lexapro that she never took Recommend begin 5 mg daily and follow up in 6 weeks at next scheduled visit   Partially dictated using Editor, commissioning. Any errors are unintentional.  Halina Maidens, MD [facility]  11/03/2019

## 2019-11-04 LAB — CBC WITH DIFFERENTIAL/PLATELET
Basophils Absolute: 0.1 10*3/uL (ref 0.0–0.2)
Basos: 1 %
EOS (ABSOLUTE): 0.1 10*3/uL (ref 0.0–0.4)
Eos: 2 %
Hematocrit: 39.6 % (ref 34.0–46.6)
Hemoglobin: 13.5 g/dL (ref 11.1–15.9)
Immature Grans (Abs): 0 10*3/uL (ref 0.0–0.1)
Immature Granulocytes: 1 %
Lymphocytes Absolute: 2.3 10*3/uL (ref 0.7–3.1)
Lymphs: 29 %
MCH: 29.5 pg (ref 26.6–33.0)
MCHC: 34.1 g/dL (ref 31.5–35.7)
MCV: 87 fL (ref 79–97)
Monocytes Absolute: 0.5 10*3/uL (ref 0.1–0.9)
Monocytes: 7 %
Neutrophils Absolute: 4.7 10*3/uL (ref 1.4–7.0)
Neutrophils: 60 %
Platelets: 327 10*3/uL (ref 150–450)
RBC: 4.57 x10E6/uL (ref 3.77–5.28)
RDW: 12.9 % (ref 11.7–15.4)
WBC: 7.8 10*3/uL (ref 3.4–10.8)

## 2019-11-04 LAB — COMPREHENSIVE METABOLIC PANEL
ALT: 37 IU/L — ABNORMAL HIGH (ref 0–32)
AST: 20 IU/L (ref 0–40)
Albumin/Globulin Ratio: 1.6 (ref 1.2–2.2)
Albumin: 4.9 g/dL — ABNORMAL HIGH (ref 3.8–4.8)
Alkaline Phosphatase: 87 IU/L (ref 44–121)
BUN/Creatinine Ratio: 17 (ref 9–23)
BUN: 12 mg/dL (ref 6–24)
Bilirubin Total: <0.2 mg/dL (ref 0.0–1.2)
CO2: 25 mmol/L (ref 20–29)
Calcium: 9.7 mg/dL (ref 8.7–10.2)
Chloride: 102 mmol/L (ref 96–106)
Creatinine, Ser: 0.7 mg/dL (ref 0.57–1.00)
GFR calc Af Amer: 117 mL/min/{1.73_m2} (ref >59)
GFR calc non Af Amer: 101 mL/min/{1.73_m2} (ref >59)
Globulin, Total: 3.1 g/dL (ref 1.5–4.5)
Glucose: 124 mg/dL — ABNORMAL HIGH (ref 65–99)
Potassium: 4.4 mmol/L (ref 3.5–5.2)
Sodium: 139 mmol/L (ref 134–144)
Total Protein: 8 g/dL (ref 6.0–8.5)

## 2019-11-04 LAB — TSH+FREE T4
Free T4: 1.05 ng/dL (ref 0.82–1.77)
TSH: 0.779 u[IU]/mL (ref 0.450–4.500)

## 2019-12-26 ENCOUNTER — Other Ambulatory Visit: Payer: Self-pay

## 2019-12-26 ENCOUNTER — Encounter: Payer: Self-pay | Admitting: Internal Medicine

## 2019-12-26 ENCOUNTER — Ambulatory Visit (INDEPENDENT_AMBULATORY_CARE_PROVIDER_SITE_OTHER): Payer: BC Managed Care – PPO | Admitting: Internal Medicine

## 2019-12-26 VITALS — BP 130/78 | HR 82 | Temp 98.2°F | Ht 61.0 in | Wt 167.0 lb

## 2019-12-26 DIAGNOSIS — E782 Mixed hyperlipidemia: Secondary | ICD-10-CM

## 2019-12-26 DIAGNOSIS — Z0000 Encounter for general adult medical examination without abnormal findings: Secondary | ICD-10-CM | POA: Diagnosis not present

## 2019-12-26 DIAGNOSIS — Z1159 Encounter for screening for other viral diseases: Secondary | ICD-10-CM

## 2019-12-26 DIAGNOSIS — Z1231 Encounter for screening mammogram for malignant neoplasm of breast: Secondary | ICD-10-CM | POA: Diagnosis not present

## 2019-12-26 DIAGNOSIS — R600 Localized edema: Secondary | ICD-10-CM | POA: Diagnosis not present

## 2019-12-26 DIAGNOSIS — Z90710 Acquired absence of both cervix and uterus: Secondary | ICD-10-CM | POA: Insufficient documentation

## 2019-12-26 LAB — POCT URINALYSIS DIPSTICK
Bilirubin, UA: NEGATIVE
Blood, UA: NEGATIVE
Glucose, UA: NEGATIVE
Ketones, UA: NEGATIVE
Leukocytes, UA: NEGATIVE
Nitrite, UA: NEGATIVE
Protein, UA: NEGATIVE
Spec Grav, UA: <=1.005 — AB (ref 1.010–1.025)
Urobilinogen, UA: 0.2 E.U./dL
pH, UA: 6 (ref 5.0–8.0)

## 2019-12-26 NOTE — Progress Notes (Signed)
Date:  12/26/2019   Name:  Isabel Miller   DOB:  November 03, 1970   MRN:  [id number]   Chief Complaint: Annual Exam (breast exam no pap)  Isabel Miller Vasey is a 49 y.o. female who presents today for her Complete Annual Exam. She feels well. She reports exercising none. She reports she is sleeping fairly well. Breast complaints none. She is under a bit of stress - her husband has taken a second job. She has muscle tension in her neck that started a few days ago.  Mammogram: 10/2018 DEXA: none Pap smear: 02/2016 cervix remains Colonoscopy: 05/2019 repeat 5 yrs  Immunization History  Administered Date(s) Administered  . Influenza,inj,Quad PF,6+ Mos 12/20/2017, 10/26/2018  . Influenza-Unspecified 11/13/2019  . PFIZER SARS-COV-2 Vaccination 04/06/2019, 04/28/2019, 11/11/2019    HPI  Lab Results  Component Value Date   CREATININE 0.70 11/03/2019   BUN 12 11/03/2019   NA 139 11/03/2019   K 4.4 11/03/2019   CL 102 11/03/2019   CO2 25 11/03/2019   Lab Results  Component Value Date   CHOL 219 (H) 12/18/2018   HDL 49 12/18/2018   LDLCALC 142 (H) 12/18/2018   TRIG 155 (H) 12/18/2018   CHOLHDL 4.5 (H) 12/18/2018   Lab Results  Component Value Date   TSH 0.779 11/03/2019   No results found for: HGBA1C Lab Results  Component Value Date   WBC 7.8 11/03/2019   HGB 13.5 11/03/2019   HCT 39.6 11/03/2019   MCV 87 11/03/2019   PLT 327 11/03/2019   Lab Results  Component Value Date   ALT 37 (H) 11/03/2019   AST 20 11/03/2019   ALKPHOS 87 11/03/2019   BILITOT <0.2 11/03/2019     Review of Systems  Constitutional: Negative for chills, fatigue, fever and unexpected weight change.  HENT: Negative for congestion, hearing loss, tinnitus, trouble swallowing and voice change.   Eyes: Negative for visual disturbance.  Respiratory: Negative for cough, chest tightness, shortness of breath and wheezing.   Cardiovascular: Negative for chest pain,  palpitations and leg swelling.  Gastrointestinal: Negative for abdominal pain, constipation, diarrhea and vomiting.  Endocrine: Negative for polydipsia and polyuria.  Genitourinary: Negative for dysuria, frequency, genital sores, vaginal bleeding and vaginal discharge.  Musculoskeletal: Positive for neck stiffness (muscle tension in the back of her head). Negative for arthralgias, gait problem and joint swelling.  Skin: Negative for color change and rash.  Neurological: Negative for dizziness, tremors, weakness, light-headedness and headaches.  Hematological: Negative for adenopathy. Does not bruise/bleed easily.  Psychiatric/Behavioral: Negative for dysphoric mood and sleep disturbance. The patient is not nervous/anxious.     Patient Active Problem List   Diagnosis Date Noted  . S/P hysterectomy 12/26/2019  . Eczema 11/03/2019  . Edema of both lower legs 11/03/2019  . Bleeding external hemorrhoids 11/03/2019  . Mixed hyperlipidemia 01/23/2019  . Environmental and seasonal allergies 09/19/2017  . Chronic pain of both knees 09/19/2017  . Rash and nonspecific skin eruption 02/27/2017  . Mood disorder ([place]) 02/27/2017    Allergies  Allergen Reactions  . Ivp Dye [Iodinated Diagnostic Agents] Other (See Comments)    convulsion    Past Surgical History:  Procedure Laterality Date  . CESAREAN SECTION    . CHOLECYSTECTOMY    . COLONOSCOPY WITH PROPOFOL N/A 05/23/2019   Procedure: COLONOSCOPY WITH PROPOFOL;  Surgeon: Jonathon Bellows, MD;  Location: [facility] ENDOSCOPY;  Service: Gastroenterology;  Laterality: N/A;  . HERNIA REPAIR    .  PARTIAL HYSTERECTOMY     bleeding  . REDUCTION MAMMAPLASTY Bilateral 2010    Social History   Tobacco Use  . Smoking status: Never Smoker  . Smokeless tobacco: Never Used  Vaping Use  . Vaping Use: Never used  Substance Use Topics  . Alcohol use: Yes    Alcohol/week: 1.0 standard drink    Types: 1 Glasses of wine per week    Comment: occas  . Drug  use: No     Medication list has been reviewed and updated.  Current Meds  Medication Sig  . Glucosamine HCl (GLUCOSAMINE PO) Take 1 tablet by mouth 3 (three) times a week.  . Multiple Vitamins-Minerals (MULTIVITAMIN WITH MINERALS) tablet Take 1 tablet by mouth daily.  . Multiple Vitamins-Minerals (ZINC PO) Take by mouth.  . pramoxine-hydrocortisone (PROCTOCREAM-HC) 1-1 % rectal cream Place 1 application rectally 2 (two) times daily.    PHQ 2/9 Scores 12/26/2019 11/03/2019 03/10/2019 12/18/2018  PHQ - 2 Score 2 3 2 4   PHQ- 9 Score 5 7 8 11     GAD 7 : Generalized Anxiety Score 12/26/2019  Nervous, Anxious, on Edge 0  Control/stop worrying 1  Worry too much - different things 1  Trouble relaxing 0  Restless 0  Easily annoyed or irritable 0  Afraid - awful might happen 0  Total GAD 7 Score 2    BP Readings from Last 3 Encounters:  12/26/19 130/78  11/03/19 124/84  05/23/19 122/82    Physical Exam Vitals and nursing note reviewed.  Constitutional:      General: She is not in acute distress.    Appearance: She is well-developed.  HENT:     Head: Normocephalic and atraumatic.     Right Ear: Tympanic membrane and ear canal normal.     Left Ear: Tympanic membrane and ear canal normal.     Nose:     Right Sinus: No maxillary sinus tenderness.     Left Sinus: No maxillary sinus tenderness.  Eyes:     General: No scleral icterus.       Right eye: No discharge.        Left eye: No discharge.     Conjunctiva/sclera: Conjunctivae normal.  Neck:     Thyroid: No thyromegaly.     Vascular: No carotid bruit.  Cardiovascular:     Rate and Rhythm: Normal rate and regular rhythm.     Pulses: Normal pulses.     Heart sounds: Normal heart sounds.  Pulmonary:     Effort: Pulmonary effort is normal. No respiratory distress.     Breath sounds: No wheezing.  Chest:     Breasts:        Right: No mass, nipple discharge, skin change or tenderness.        Left: No mass, nipple  discharge, skin change or tenderness.     Comments: Breast reduction scars bilaterally Abdominal:     General: Bowel sounds are normal.     Palpations: Abdomen is soft.     Tenderness: There is no abdominal tenderness.  Musculoskeletal:        General: Normal range of motion.     Cervical back: Normal range of motion. No erythema. Muscular tenderness (on posterior right) present.     Right lower leg: No edema.     Left lower leg: No edema.  Lymphadenopathy:     Cervical: No cervical adenopathy.  Skin:    General: Skin is warm and dry.  Findings: No rash.  Neurological:     Mental Status: She is alert and oriented to person, place, and time.     Cranial Nerves: No cranial nerve deficit.     Sensory: No sensory deficit.     Deep Tendon Reflexes: Reflexes are normal and symmetric.  Psychiatric:        Attention and Perception: Attention normal.        Mood and Affect: Mood normal.     Wt Readings from Last 3 Encounters:  12/26/19 167 lb (75.8 kg)  11/03/19 163 lb (73.9 kg)  05/23/19 150 lb (68 kg)    BP 130/78   Pulse 82   Temp 98.2 F (36.8 C) (Oral)   Ht 5\' 1"  (1.549 m)   Wt 167 lb (75.8 kg)   SpO2 97%   BMI 31.55 kg/m   Assessment and Plan: 1. Annual physical exam Exam is normal except for weight. Encourage regular exercise and appropriate dietary changes. Continue regular exercise, healthy diet Heat and ibuprofen for neck discomfort - CBC with Differential/Platelet - Comprehensive metabolic panel - POCT urinalysis dipstick  2. Encounter for screening mammogram for breast cancer Schedule at [place]; Future  3. Need for hepatitis C screening test - Hepatitis C antibody  4. Mixed hyperlipidemia Will advise if medication is needed - Lipid panel  5. Edema of both lower legs Controlled with increased water intake and elevation - TSH  6. S/P hysterectomy Cervix remains per patient Will be due to Pap next  year   Partially dictated using Editor, commissioning. Any errors are unintentional.  Halina Maidens, MD [facility]  12/26/2019

## 2019-12-27 LAB — LIPID PANEL
Chol/HDL Ratio: 4.3 ratio (ref 0.0–4.4)
Cholesterol, Total: 186 mg/dL (ref 100–199)
HDL: 43 mg/dL (ref >39)
LDL Chol Calc (NIH): 121 mg/dL — ABNORMAL HIGH (ref 0–99)
Triglycerides: 124 mg/dL (ref 0–149)
VLDL Cholesterol Cal: 22 mg/dL (ref 5–40)

## 2019-12-27 LAB — COMPREHENSIVE METABOLIC PANEL
ALT: 29 IU/L (ref 0–32)
AST: 19 IU/L (ref 0–40)
Albumin/Globulin Ratio: 1.6 (ref 1.2–2.2)
Albumin: 4.5 g/dL (ref 3.8–4.8)
Alkaline Phosphatase: 66 IU/L (ref 44–121)
BUN/Creatinine Ratio: 14 (ref 9–23)
BUN: 7 mg/dL (ref 6–24)
Bilirubin Total: 0.3 mg/dL (ref 0.0–1.2)
CO2: 21 mmol/L (ref 20–29)
Calcium: 9.1 mg/dL (ref 8.7–10.2)
Chloride: 104 mmol/L (ref 96–106)
Creatinine, Ser: 0.51 mg/dL — ABNORMAL LOW (ref 0.57–1.00)
GFR calc Af Amer: 130 mL/min/{1.73_m2} (ref >59)
GFR calc non Af Amer: 112 mL/min/{1.73_m2} (ref >59)
Globulin, Total: 2.8 g/dL (ref 1.5–4.5)
Glucose: 88 mg/dL (ref 65–99)
Potassium: 4.3 mmol/L (ref 3.5–5.2)
Sodium: 141 mmol/L (ref 134–144)
Total Protein: 7.3 g/dL (ref 6.0–8.5)

## 2019-12-27 LAB — CBC WITH DIFFERENTIAL/PLATELET
Basophils Absolute: 0.1 10*3/uL (ref 0.0–0.2)
Basos: 1 %
EOS (ABSOLUTE): 0.1 10*3/uL (ref 0.0–0.4)
Eos: 1 %
Hematocrit: 39.9 % (ref 34.0–46.6)
Hemoglobin: 13.3 g/dL (ref 11.1–15.9)
Immature Grans (Abs): 0.1 10*3/uL (ref 0.0–0.1)
Immature Granulocytes: 1 %
Lymphocytes Absolute: 2 10*3/uL (ref 0.7–3.1)
Lymphs: 26 %
MCH: 29.1 pg (ref 26.6–33.0)
MCHC: 33.3 g/dL (ref 31.5–35.7)
MCV: 87 fL (ref 79–97)
Monocytes Absolute: 0.5 10*3/uL (ref 0.1–0.9)
Monocytes: 6 %
Neutrophils Absolute: 4.9 10*3/uL (ref 1.4–7.0)
Neutrophils: 65 %
Platelets: 324 10*3/uL (ref 150–450)
RBC: 4.57 x10E6/uL (ref 3.77–5.28)
RDW: 13.6 % (ref 11.7–15.4)
WBC: 7.6 10*3/uL (ref 3.4–10.8)

## 2019-12-27 LAB — HEPATITIS C ANTIBODY: Hep C Virus Ab: 0.1 s/co ratio (ref 0.0–0.9)

## 2019-12-27 LAB — TSH: TSH: 1.07 u[IU]/mL (ref 0.450–4.500)

## 2020-01-19 ENCOUNTER — Ambulatory Visit: Payer: BC Managed Care – PPO | Admitting: Internal Medicine

## 2020-02-10 ENCOUNTER — Ambulatory Visit: Payer: BC Managed Care – PPO

## 2020-02-12 ENCOUNTER — Ambulatory Visit
Admission: RE | Admit: 2020-02-12 | Discharge: 2020-02-12 | Disposition: A | Discharge status: Home / Self Care | Payer: BC Managed Care – PPO | Source: Ambulatory Visit | Attending: Internal Medicine | Admitting: Internal Medicine

## 2020-02-12 ENCOUNTER — Other Ambulatory Visit: Payer: Self-pay

## 2020-02-12 DIAGNOSIS — Z1231 Encounter for screening mammogram for malignant neoplasm of breast: Secondary | ICD-10-CM | POA: Insufficient documentation

## 2020-02-19 ENCOUNTER — Telehealth: Payer: Self-pay | Admitting: Internal Medicine

## 2020-02-19 NOTE — Telephone Encounter (Signed)
Called pt with negative mammogram. Pt verbalized understanding.  KP

## 2020-02-19 NOTE — Telephone Encounter (Signed)
Patient is calling for her Mammogram results. Please advise CB- [phone]

## 2020-03-08 DIAGNOSIS — H5213 Myopia, bilateral: Secondary | ICD-10-CM | POA: Diagnosis not present

## 2020-03-23 ENCOUNTER — Ambulatory Visit: Payer: Self-pay | Admitting: Internal Medicine

## 2020-03-23 NOTE — Telephone Encounter (Signed)
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 Denies any associated symptoms. Agent had scheduled appt per pt's request prior to triage; 04/05/20. Home care advise given per protocol.  Pt verbalizes understanding.  Reason for Disposition . [1] Normal formed BM AND [2] few streaks or drops of blood on surface of BM . Rectal bleeding is a chronic symptom (recurrent or ongoing AND present > 4 weeks)  Answer Assessment - Initial Assessment Questions 1. APPEARANCE of BLOOD: "What color is it?" "Is it passed separately, on the surface of the stool, or mixed in with the stool?"      Bright red 2. AMOUNT: "How much blood was passed?"      Small amount 3. FREQUENCY: "How many times has blood been passed with the stools?"      Only with hard BMs 4. ONSET: "When was the blood first seen in the stools?" (Days or weeks)      2 months ago. 5. DIARRHEA: "Is there also some diarrhea?" If Yes, ask: "How many diarrhea stools were passed in past 24 hours?"      no 6. CONSTIPATION: "Do you have constipation?" If Yes, ask: "How bad is it?"     At times 7. RECURRENT SYMPTOMS: "Have you had blood in your stools before?" If Yes, ask: "When was the last time?" and "What happened that time?"      Yes, Hemorrhoids.  8. BLOOD THINNERS: "Do you take any blood thinners?" (e.g., Coumnoadin/warfarin, Pradaxa/dabigatran, aspirin)    no 9. OTHER SYMPTOMS: "Do you have any other symptoms?"  (e.g., abdominal pain, vomiting, dizziness, fever)    no  Protocols used: RECTAL BLEEDING-A-AH

## 2020-03-24 NOTE — Telephone Encounter (Signed)
Noted  Pt scheduled an appt.  KP

## 2020-04-05 ENCOUNTER — Encounter: Payer: Self-pay | Admitting: Internal Medicine

## 2020-04-05 ENCOUNTER — Ambulatory Visit: Payer: BC Managed Care – PPO | Admitting: Internal Medicine

## 2020-04-05 ENCOUNTER — Other Ambulatory Visit: Payer: Self-pay

## 2020-04-05 VITALS — BP 120/84 | HR 104 | Temp 98.2°F | Ht 61.0 in | Wt 170.0 lb

## 2020-04-05 DIAGNOSIS — G5601 Carpal tunnel syndrome, right upper limb: Secondary | ICD-10-CM | POA: Insufficient documentation

## 2020-04-05 DIAGNOSIS — K648 Other hemorrhoids: Secondary | ICD-10-CM

## 2020-04-05 DIAGNOSIS — F39 Unspecified mood [affective] disorder: Secondary | ICD-10-CM

## 2020-04-05 MED ORDER — ESCITALOPRAM OXALATE 10 MG PO TABS: 10.0000 mg | ORAL_TABLET | Freq: Every day | ORAL | 1 refills | Status: DC

## 2020-04-05 NOTE — Progress Notes (Signed)
Date:  04/05/2020   Name:  Isabel Miller   DOB:  15-Jul-1970   MRN:  [id number]   Chief Complaint: Hemorrhoids (X1 year, hemorrhoids worsened, bleeding, hurts to sit down and walk, feels better when putting ice on it, has more than one lump ), Depression (Wants to take lexapro again ), and Numbness (X2 weeks,  Right arm from elbow to finger tips, happens every day,feels like pin and needles )  Depression        This is a recurrent problem.  The problem occurs daily.The problem is unchanged.  Associated symptoms include restlessness and sad.  Associated symptoms include no fatigue.     The symptoms are aggravated by family issues, work stress and social issues.  Past treatments include SSRIs - Selective serotonin reuptake inhibitors.  Compliance with prior treatments: was taking 10 mg lexapro every other day but ran out 2 weeks ago.  Previous treatment provided significant relief.  Hemorrhoids - of note her colonoscopy done 05/2019 reported normal rectal exam without a finding of hemorrhoids.  She has more discomfort with sitting, there has been bright red blood and lumps noted. Used wet wipes and topical cream and ice.  Tingling - in right hand with pain in right forearm.  Wakes her up at night, has to shake her hand to get the feeling back.  No weakness noted.  Lab Results  Component Value Date   CREATININE 0.51 (L) 12/26/2019   BUN 7 12/26/2019   NA 141 12/26/2019   K 4.3 12/26/2019   CL 104 12/26/2019   CO2 21 12/26/2019   Lab Results  Component Value Date   CHOL 186 12/26/2019   HDL 43 12/26/2019   LDLCALC 121 (H) 12/26/2019   TRIG 124 12/26/2019   CHOLHDL 4.3 12/26/2019   Lab Results  Component Value Date   TSH 1.070 12/26/2019   No results found for: HGBA1C Lab Results  Component Value Date   WBC 7.6 12/26/2019   HGB 13.3 12/26/2019   HCT 39.9 12/26/2019   MCV 87 12/26/2019   PLT 324 12/26/2019   Lab Results  Component Value Date   ALT 29  12/26/2019   AST 19 12/26/2019   ALKPHOS 66 12/26/2019   BILITOT 0.3 12/26/2019     Review of Systems  Constitutional: Negative for chills, fatigue and fever.  Respiratory: Negative for choking and shortness of breath.   Cardiovascular: Negative for chest pain and palpitations.  Gastrointestinal: Positive for blood in stool, hematochezia and rectal pain.  Musculoskeletal: Negative for arthralgias, gait problem and joint swelling.  Neurological: Positive for numbness. Negative for weakness.  Psychiatric/Behavioral: Positive for depression and dysphoric mood. Negative for sleep disturbance. The patient is nervous/anxious.     Patient Active Problem List   Diagnosis Date Noted  . Carpal tunnel syndrome of right wrist 04/05/2020  . S/P hysterectomy 12/26/2019  . Eczema 11/03/2019  . Edema of both lower legs 11/03/2019  . Bleeding external hemorrhoids 11/03/2019  . Mixed hyperlipidemia 01/23/2019  . Environmental and seasonal allergies 09/19/2017  . Chronic pain of both knees 09/19/2017  . Rash and nonspecific skin eruption 02/27/2017  . Mood disorder ([place]) 02/27/2017    Allergies  Allergen Reactions  . Ivp Dye [Iodinated Diagnostic Agents] Other (See Comments)    convulsion    Past Surgical History:  Procedure Laterality Date  . CESAREAN SECTION    . CHOLECYSTECTOMY    . COLONOSCOPY WITH PROPOFOL N/A 05/23/2019   Procedure:  COLONOSCOPY WITH PROPOFOL;  Surgeon: Jonathon Bellows, MD;  Location: [facility] ENDOSCOPY;  Service: Gastroenterology;  Laterality: N/A;  . HERNIA REPAIR    . PARTIAL HYSTERECTOMY     bleeding  . REDUCTION MAMMAPLASTY Bilateral 2010    Social History   Tobacco Use  . Smoking status: Never Smoker  . Smokeless tobacco: Never Used  Vaping Use  . Vaping Use: Never used  Substance Use Topics  . Alcohol use: Yes    Alcohol/week: 1.0 standard drink    Types: 1 Glasses of wine per week    Comment: occas  . Drug use: No     Medication list has been  reviewed and updated.  Current Meds  Medication Sig  . Glucosamine HCl (GLUCOSAMINE PO) Take 1 tablet by mouth 3 (three) times a week.  . Multiple Vitamins-Minerals (MULTIVITAMIN WITH MINERALS) tablet Take 1 tablet by mouth daily.  . Multiple Vitamins-Minerals (ZINC PO) Take by mouth.  . pramoxine-hydrocortisone (PROCTOCREAM-HC) 1-1 % rectal cream Place 1 application rectally 2 (two) times daily.    PHQ 2/9 Scores 04/05/2020 12/26/2019 11/03/2019 03/10/2019  PHQ - 2 Score 4 2 3 2   PHQ- 9 Score 6 5 7 8     GAD 7 : Generalized Anxiety Score 04/05/2020 12/26/2019  Nervous, Anxious, on Edge 1 0  Control/stop worrying 3 1  Worry too much - different things 3 1  Trouble relaxing 0 0  Restless 0 0  Easily annoyed or irritable 2 0  Afraid - awful might happen 0 0  Total GAD 7 Score 9 2    BP Readings from Last 3 Encounters:  04/05/20 120/84  12/26/19 130/78  11/03/19 124/84    Physical Exam Vitals and nursing note reviewed.  Constitutional:      General: She is not in acute distress.    Appearance: She is well-developed.  HENT:     Head: Normocephalic and atraumatic.  Cardiovascular:     Rate and Rhythm: Normal rate and regular rhythm.  Pulmonary:     Effort: Pulmonary effort is normal. No respiratory distress.  Genitourinary:    Rectum: Guaiac result negative.     Comments: Several external skin tags at the rectum Possible external hemorrhoid at 3 oclock not bulging Internal exam difficult due to pain Musculoskeletal:     Right wrist: No tenderness. Normal range of motion. Normal pulse.     Left wrist: No tenderness. Normal range of motion. Normal pulse.     Comments: Positive Phalen's on right Grip 5/5 Pulses intact  Skin:    General: Skin is warm and dry.     Findings: No rash.  Neurological:     Mental Status: She is alert and oriented to person, place, and time.  Psychiatric:        Attention and Perception: Attention normal.        Mood and Affect: Mood normal.  Affect is tearful.        Speech: Speech normal.        Behavior: Behavior normal.        Thought Content: Thought content does not include suicidal ideation. Thought content does not include suicidal plan.        Cognition and Memory: Cognition normal.     Wt Readings from Last 3 Encounters:  04/05/20 170 lb (77.1 kg)  12/26/19 167 lb (75.8 kg)  11/03/19 163 lb (73.9 kg)    BP 120/84   Pulse (!) 104   Temp 98.2 F (36.8 C) (Oral)  Ht 5\' 1"  (1.549 m)   Wt 170 lb (77.1 kg)   SpO2 97%   BMI 32.12 kg/m   Assessment and Plan: 1. Internal hemorrhoid, bleeding With evidence of prior hemorrhoids Will refer to [place] [place] due to prolonged sx without relief with topical agents - Ambulatory referral to General Surgery  2. Mood disorder ([place]) Resume lexapro - can take 1/2 daily if desired - escitalopram (LEXAPRO) 10 MG tablet; Take 1 tablet (10 mg total) by mouth daily.  Dispense: 30 tablet; Refill: 1  3. Carpal tunnel syndrome of right wrist Wear splint while sleeping If no improvement, consider referral   Partially dictated using Dragon software. Any errors are unintentional.  Halina Maidens, MD [facility]  04/05/2020

## 2020-04-05 NOTE — Patient Instructions (Signed)
Get a cock up wrist splint to wear while sleeping.  Carpal Tunnel Syndrome  Carpal tunnel syndrome is a condition that causes pain, numbness, and weakness in your hand and fingers. The carpal tunnel is a narrow area located on the palm side of your wrist. Repeated wrist motion or certain diseases may cause swelling within the tunnel. This swelling pinches the main nerve in the wrist. The main nerve in the wrist is called the median nerve. What are the causes? This condition may be caused by:  Repeated and forceful wrist and hand motions.  Wrist injuries.  Arthritis.  A cyst or tumor in the carpal tunnel.  Fluid buildup during pregnancy.  Use of tools that vibrate. Sometimes the cause of this condition is not known. What increases the risk? The following factors may make you more likely to develop this condition:  Having a job that requires you to repeatedly or forcefully move your wrist or hand or requires you to use tools that vibrate. This may include jobs that involve using computers, working on an [employer], or working with [employer] such as Pension scheme manager.  Being a woman.  Having certain conditions, such as: ? Diabetes. ? Obesity. ? An underactive thyroid (hypothyroidism). ? Kidney failure. ? Rheumatoid arthritis. What are the signs or symptoms? Symptoms of this condition include:  A tingling feeling in your fingers, especially in your thumb, index, and middle fingers.  Tingling or numbness in your hand.  An aching feeling in your entire arm, especially when your wrist and elbow are bent for a long time.  Wrist pain that goes up your arm to your shoulder.  Pain that goes down into your palm or fingers.  A weak feeling in your hands. You may have trouble grabbing and holding items. Your symptoms may feel worse during the night. How is this diagnosed? This condition is diagnosed with a medical history and physical exam. You may also have tests,  including:  Electromyogram (EMG). This test measures electrical signals sent by your nerves into the muscles.  Nerve conduction study. This test measures how well electrical signals pass through your nerves.  Imaging tests, such as X-rays, ultrasound, and MRI. These tests check for possible causes of your condition. How is this treated? This condition may be treated with:  Lifestyle changes. It is important to stop or change the activity that caused your condition.  Doing exercise and activities to strengthen and stretch your muscles and tendons (physical therapy).  Making lifestyle changes to help with your condition and learning how to do your daily activities safely (occupational therapy).  Medicines for pain and inflammation. This may include medicine that is injected into your wrist.  A wrist splint or brace.  Surgery. Follow these instructions at home: If you have a splint or brace:  Wear the splint or brace as told by your health care provider. Remove it only as told by your health care provider.  Loosen the splint or brace if your fingers tingle, become numb, or turn cold and blue.  Keep the splint or brace clean.  If the splint or brace is not waterproof: ? Do not let it get wet. ? Cover it with a watertight covering when you take a bath or shower. Managing pain, stiffness, and swelling If directed, put ice on the painful area. To do this:  If you have a removeable splint or brace, remove it as told by your health care provider.  Put ice in a plastic  bag.  Place a towel between your skin and the bag or between the splint or brace and the bag.  Leave the ice on for 20 minutes, 2-3 times a day. Do not fall asleep with the cold pack on your skin.  Remove the ice if your skin turns bright red. This is very important. If you cannot feel pain, heat, or cold, you have a greater risk of damage to the area. Move your fingers often to reduce stiffness and swelling.    General instructions  Take over-the-counter and prescription medicines only as told by your health care provider.  Rest your wrist and hand from any activity that may be causing your pain. If your condition is work related, talk with your employer about changes that can be made, such as getting a wrist pad to use while typing.  Do any exercises as told by your health care provider, physical therapist, or occupational therapist.  Keep all follow-up visits. This is important. Contact a health care provider if:  You have new symptoms.  Your pain is not controlled with medicines.  Your symptoms get worse. Get help right away if:  You have severe numbness or tingling in your wrist or hand. Summary  Carpal tunnel syndrome is a condition that causes pain, numbness, and weakness in your hand and fingers.  It is usually caused by repeated wrist motions.  Lifestyle changes and medicines are used to treat carpal tunnel syndrome. Surgery may be recommended.  Follow your health care provider's instructions about wearing a splint, resting from activity, keeping follow-up visits, and calling for help. This information is not intended to replace advice given to you by your health care provider. Make sure you discuss any questions you have with your health care provider. Document Revised: 06/05/2019 Document Reviewed: 06/05/2019 Elsevier Patient Education  [place].

## 2020-04-08 ENCOUNTER — Ambulatory Visit: Payer: BC Managed Care – PPO | Admitting: General Surgery

## 2020-04-08 ENCOUNTER — Encounter: Payer: Self-pay | Admitting: General Surgery

## 2020-04-08 ENCOUNTER — Other Ambulatory Visit: Payer: Self-pay

## 2020-04-08 VITALS — BP 149/89 | HR 86 | Temp 98.5°F | Ht 65.0 in | Wt 164.0 lb

## 2020-04-08 DIAGNOSIS — K649 Unspecified hemorrhoids: Secondary | ICD-10-CM | POA: Diagnosis not present

## 2020-04-08 NOTE — Progress Notes (Signed)
Patient ID: Isabel Miller, female   DOB: 1970/11/21, 50 y.o.   MRN: [id number]  Chief Complaint  Patient presents with  . New Patient (Initial Visit)    Hemorrhoids (internal)    HPI Isabel Miller is a 50 y.o. female.   She has been referred by her primary care provider, Dr. Army Melia, for evaluation of hemorrhoid disease.  She states that she has had hemorrhoids for 1-1/2 to 2 years.  Her most recent episode began about a month ago.  She denies any diarrhea or constipation.  She has noticed some bright red blood on the toilet paper as well as some faint staining in the toilet bowl water.  She has also seen a small clot on the toilet paper she has never had a thrombosed hemorrhoid.  She denies any pain, burning, or itching.  She says that she can feel something in the area when she sits and feels like there is something external.  Dr. Army Melia prescribed ProctoCream.  [name] states that she has not been using that regularly, as she did not feel it was particularly helpful.  She had a colonoscopy in April 2021.  There was no description of internal or external hemorrhoids in the documentation from this procedure.  She does state that after she saw Dr. Army Melia, and Dr. Army Melia performed a rectal exam, that she felt like something "closed off" and she has not experienced any bleeding since that time.  She has been using a donut at work, as she sits for prolonged periods of time.  She also feels like ice is helpful.   Past Medical History:  Diagnosis Date  . Heavy menses     Past Surgical History:  Procedure Laterality Date  . CESAREAN SECTION    . CHOLECYSTECTOMY    . COLONOSCOPY WITH PROPOFOL N/A 05/23/2019   Procedure: COLONOSCOPY WITH PROPOFOL;  Surgeon: Jonathon Bellows, MD;  Location[facility] ENDOSCOPY;  Service: Gastroenterology;  Laterality: N/A;  . HERNIA REPAIR    . PARTIAL HYSTERECTOMY     bleeding  . REDUCTION MAMMAPLASTY Bilateral 2010     Family History  Problem Relation Age of Onset  . Hypertension Father   . Breast cancer Neg Hx   . Ovarian cancer Neg Hx   . Colon cancer Neg Hx   . Diabetes Neg Hx     Social History Social History   Tobacco Use  . Smoking status: Never Smoker  . Smokeless tobacco: Never Used  Vaping Use  . Vaping Use: Never used  Substance Use Topics  . Alcohol use: Yes    Alcohol/week: 1.0 standard drink    Types: 1 Glasses of wine per week    Comment: occas  . Drug use: No    Allergies  Allergen Reactions  . Ivp Dye [Iodinated Diagnostic Agents] Other (See Comments)    convulsion    Current Outpatient Medications  Medication Sig Dispense Refill  . escitalopram (LEXAPRO) 10 MG tablet Take 1 tablet (10 mg total) by mouth daily. 30 tablet 1  . Glucosamine HCl (GLUCOSAMINE PO) Take 1 tablet by mouth 3 (three) times a week.    . Multiple Vitamins-Minerals (MULTIVITAMIN WITH MINERALS) tablet Take 1 tablet by mouth daily.    . Multiple Vitamins-Minerals (ZINC PO) Take by mouth.    . pramoxine-hydrocortisone (PROCTOCREAM-HC) 1-1 % rectal cream Place 1 application rectally 2 (two) times daily. 30 g 0   No current facility-administered medications for this visit.  Review of Systems Review of Systems  Gastrointestinal: Positive for anal bleeding and blood in stool. Negative for abdominal pain, constipation, diarrhea and rectal pain.  Neurological: Positive for numbness.       In hand  Psychiatric/Behavioral: Positive for dysphoric mood.  All other systems reviewed and are negative.   Blood pressure (!) 149/89, pulse 86, temperature 98.5 F (36.9 C), temperature source Oral, height 5\' 5"  (1.651 m), weight 164 lb (74.4 kg), SpO2 98 %. Body mass index is 27.29 kg/m.  Physical Exam Physical Exam Vitals reviewed. Exam conducted with a chaperone present.  Constitutional:      General: She is not in acute distress.    Appearance: She is obese.  HENT:     Head: Normocephalic and  atraumatic.     Nose:     Comments: Covered with a mask    Mouth/Throat:     Comments: Covered with a mask Eyes:     General: No scleral icterus.       Right eye: No discharge.        Left eye: No discharge.  Neck:     Comments: No palpable cervical or supraclavicular lymphadenopathy.  The trachea is midline.  No discrete thyroid masses or thyromegaly appreciated.  The gland moves freely with deglutition. Cardiovascular:     Rate and Rhythm: Normal rate and regular rhythm.     Pulses: Normal pulses.  Pulmonary:     Effort: Pulmonary effort is normal.     Breath sounds: Normal breath sounds.  Abdominal:     General: Bowel sounds are normal.     Palpations: Abdomen is soft.  Genitourinary:      Comments: Multiple skin tags present.  No visible external hemorrhoids or stigmata of bleeding.  Digital rectal exam negative for masses, although the tissues feel somewhat fleshy.  No gross blood. Musculoskeletal:        General: Tenderness present. No swelling.  Skin:    General: Skin is warm and dry.  Neurological:     General: No focal deficit present.     Mental Status: She is alert and oriented to person, place, and time.  Psychiatric:        Mood and Affect: Mood normal.        Behavior: Behavior normal.     Data Reviewed I reviewed the colonoscopy report and images from the procedure performed in April 2021.  No hemorrhoidal disease was described by the performing gastroenterologist.  I also reviewed clinic notes from Dr. Gaspar Cola office.  At the November 03, 2019 visit, the patient was seen for rectal bleeding that started little more than 2 weeks prior to that visit.  No pain was present at that time.  Dr. Gaspar Cola note describes soft tags in the perianal area.  At that visit, she prescribed the [facility].  A telephone note from Dr. Gaspar Cola nurse triage system dated March 23, 2020 describes a few streaks or drops of blood on the surface of bowel movements when  bowel movements are hard.  Some small clots were seen with hard bowel movements as well.  No other associated symptoms.  This triage call resulted in a visit on April 05, 2020.  The physical exam from that visit described several external skin tags at the rectum with a possible external hemorrhoid at 3:00 that was not bulging.  Assessment This is a 50 year old woman who has had intermittent episodes of blood on the surface of her stool, as well as some small  clots and faint toilet water staining.  She has not had any pain, burning, or itching associated with this.  Physical examination does not suggest any current external hemorrhoid disease; there are skin tags present suggestive of prior episodes of external hemorrhoids.  I think it is more likely, if the bleeding is caused by hemorrhoids, that these are internal.  I have recommended against surgical excision, due to the limited nature of her disease as well as the significant pain that is typically associated with open hemorrhoidectomy.  Plan We have placed a referral to gastroenterology to Dr. Verlin Grills hemorrhoid banding clinic.  At this time, Ms.Scheryl Marten does not have any general surgical needs and we will see her on an as-needed basis.    Fredirick Maudlin 04/08/2020, 4:32 PM

## 2020-04-08 NOTE — Patient Instructions (Addendum)
A referral to GI has been placed. They will call you for an appointment. You can try Preparation-H containing phenylephrine. If you have any concerns or questions, please feel free to call our office.     How to Take a [employer] A sitz bath is a warm water bath that may be used to care for your rectum, genital area, or the area between your rectum and genitals (perineum). In a sitz bath, the water only comes up to your hips and covers your buttocks. A sitz bath may be done in a bathtub or with a portable sitz bath that fits over the toilet. Your health care provider may recommend a sitz bath to help:  Relieve pain and discomfort after delivering a baby.  Relieve pain and itching from hemorrhoids or anal fissures.  Relieve pain after certain surgeries.  Relax muscles that are sore or tight. How to take a sitz bath Take 3-4 sitz baths a day, or as many as told by your health care provider. Bathtub sitz bath To take a sitz bath in a bathtub: 1. Partially fill a bathtub with warm water. The water should be deep enough to cover your hips and buttocks when you are sitting in the tub. 2. Follow your health care provider's instructions if you are told to put medicine in the water. 3. Sit in the water. Open the tub drain a little, and leave it open during your bath. 4. Turn on the warm water again, enough to replace the water that is draining out. Keep the water running throughout your bath. This helps keep the water at the right level and temperature. 5. Soak in the water for 15-20 minutes, or as long as told by your health care provider. 6. When you are done, be careful when you stand up. You may feel dizzy. 7. After the sitz bath, pat yourself dry. Do not rub your skin to dry it.   Over-the-toilet sitz bath To take a sitz bath with an over-the-toilet basin: 1. Follow the manufacturer's instructions. 2. Fill the basin with warm water. 3. Follow your health care provider's instructions if you  were told to put medicine in the water. 4. Sit on the seat. Make sure the water covers your buttocks and perineum. 5. Soak in the water for 15-20 minutes, or as long as told by your health care provider. 6. After the sitz bath, pat yourself dry. Do not rub your skin to dry it. 7. Clean and dry the basin between uses. 8. Discard the basin if it cracks, or according to the manufacturer's instructions.   Contact a health care provider if:  Your pain or itching gets worse. Do not continue with sitz baths if your symptoms get worse.  You have new symptoms. Do not continue with sitz baths until you talk with your health care provider. Summary  A sitz bath is a warm water bath in which the water only comes up to your hips and covers your buttocks.  A sitz bath may help relieve pain and discomfort after delivering a baby. It also may help with pain and itching from hemorrhoids or anal fissures, or pain after certain surgeries. It can also help to relax muscles that are sore or tight.  Take 3-4 sitz baths a day, or as many as told by your health care provider. Soak in the water for 15-20 minutes.  Do not continue with sitz baths if your symptoms get worse. This information is not intended to replace advice  given to you by your health care provider. Make sure you discuss any questions you have with your health care provider. Document Revised: 10/09/2019 Document Reviewed: 10/09/2019 Elsevier Patient Education  2021 [place].     Hemorrhoids Hemorrhoids are swollen veins in and around the rectum or anus. There are two types of hemorrhoids:  Internal hemorrhoids. These occur in the veins that are just inside the rectum. They may poke through to the outside and become irritated and painful.  External hemorrhoids. These occur in the veins that are outside the anus and can be felt as a painful swelling or hard lump near the anus. Most hemorrhoids do not cause serious problems, and they can be  managed with home treatments such as diet and lifestyle changes. If home treatments do not help the symptoms, procedures can be done to shrink or remove the hemorrhoids. What are the causes? This condition is caused by increased pressure in the anal area. This pressure may result from various things, including:  Constipation.  Straining to have a bowel movement.  Diarrhea.  Pregnancy.  Obesity.  Sitting for long periods of time.  Heavy lifting or other activity that causes you to strain.  Anal sex.  Riding a bike for a long period of time. What are the signs or symptoms? Symptoms of this condition include:  Pain.  Anal itching or irritation.  Rectal bleeding.  Leakage of stool (feces).  Anal swelling.  One or more lumps around the anus. How is this diagnosed? This condition can often be diagnosed through a visual exam. Other exams or tests may also be done, such as:  An exam that involves feeling the rectal area with a gloved hand (digital rectal exam).  An exam of the anal canal that is done using a small tube (anoscope).  A blood test, if you have lost a significant amount of blood.  A test to look inside the colon using a flexible tube with a camera on the end (sigmoidoscopy or colonoscopy). How is this treated? This condition can usually be treated at home. However, various procedures may be done if dietary changes, lifestyle changes, and other home treatments do not help your symptoms. These procedures can help make the hemorrhoids smaller or remove them completely. Some of these procedures involve surgery, and others do not. Common procedures include:  Rubber band ligation. Rubber bands are placed at the base of the hemorrhoids to cut off their blood supply.  Sclerotherapy. Medicine is injected into the hemorrhoids to shrink them.  Infrared coagulation. A type of light energy is used to get rid of the hemorrhoids.  Hemorrhoidectomy surgery. The hemorrhoids  are surgically removed, and the veins that supply them are tied off.  Stapled hemorrhoidopexy surgery. The surgeon staples the base of the hemorrhoid to the rectal wall. Follow these instructions at home: Eating and drinking  Eat foods that have a lot of fiber in them, such as whole grains, beans, nuts, fruits, and vegetables.  Ask your health care provider about taking products that have added fiber (fiber supplements).  Reduce the amount of fat in your diet. You can do this by eating low-fat dairy products, eating less red meat, and avoiding processed foods.  Drink enough fluid to keep your urine pale yellow.   Managing pain and swelling  Take warm sitz baths for 20 minutes, 3-4 times a day to ease pain and discomfort. You may do this in a bathtub or using a portable sitz bath that fits over  the toilet.  If directed, apply ice to the affected area. Using ice packs between sitz baths may be helpful. ? Put ice in a plastic bag. ? Place a towel between your skin and the bag. ? Leave the ice on for 20 minutes, 2-3 times a day.   General instructions  Take over-the-counter and prescription medicines only as told by your health care provider.  Use medicated creams or suppositories as told.  Get regular exercise. Ask your health care provider how much and what kind of exercise is best for you. In general, you should do moderate exercise for at least 30 minutes on most days of the week (150 minutes each week). This can include activities such as walking, biking, or yoga.  Go to the bathroom when you have the urge to have a bowel movement. Do not wait.  Avoid straining to have bowel movements.  Keep the anal area dry and clean. Use wet toilet paper or moist towelettes after a bowel movement.  Do not sit on the toilet for long periods of time. This increases blood pooling and pain.  Keep all follow-up visits as told by your health care provider. This is important. Contact a health care  provider if you have:  Increasing pain and swelling that are not controlled by treatment or medicine.  Difficulty having a bowel movement, or you are unable to have a bowel movement.  Pain or inflammation outside the area of the hemorrhoids. Get help right away if you have:  Uncontrolled bleeding from your rectum. Summary  Hemorrhoids are swollen veins in and around the rectum or anus.  Most hemorrhoids can be managed with home treatments such as diet and lifestyle changes.  Taking warm sitz baths can help ease pain and discomfort.  In severe cases, procedures or surgery can be done to shrink or remove the hemorrhoids. This information is not intended to replace advice given to you by your health care provider. Make sure you discuss any questions you have with your health care provider. Document Revised: 06/21/2018 Document Reviewed: 06/14/2017 Elsevier Patient Education  [place].

## 2020-04-12 DIAGNOSIS — K644 Residual hemorrhoidal skin tags: Secondary | ICD-10-CM | POA: Diagnosis not present

## 2020-04-27 DIAGNOSIS — K644 Residual hemorrhoidal skin tags: Secondary | ICD-10-CM | POA: Diagnosis not present

## 2020-04-27 DIAGNOSIS — K648 Other hemorrhoids: Secondary | ICD-10-CM | POA: Diagnosis not present

## 2020-05-13 ENCOUNTER — Telehealth: Payer: Self-pay

## 2020-05-13 NOTE — Telephone Encounter (Signed)
Copied from [place] [id number]. Topic: Referral - Status >> May 13, 2020  2:58 PM Yvette Rack wrote: Reason for CRM: Pt requests a referral for a GI doctor in [place] to receive a second opinion

## 2020-05-13 NOTE — Telephone Encounter (Signed)
Called pt let her know that she seen Dr. Celine Ahr and at that visit a RF was place for [facility] Dr. Marius Ditch. Gave pt phone number to call to schedule an appt. [phone] Pt verbalized understanding.   KP

## 2020-05-19 ENCOUNTER — Other Ambulatory Visit: Payer: Self-pay

## 2020-05-19 ENCOUNTER — Ambulatory Visit: Payer: BC Managed Care – PPO | Admitting: Internal Medicine

## 2020-05-19 ENCOUNTER — Encounter: Payer: Self-pay | Admitting: Internal Medicine

## 2020-05-19 VITALS — BP 126/84 | HR 86 | Temp 98.4°F | Ht 61.0 in | Wt 167.0 lb

## 2020-05-19 DIAGNOSIS — L700 Acne vulgaris: Secondary | ICD-10-CM | POA: Diagnosis not present

## 2020-05-19 DIAGNOSIS — R590 Localized enlarged lymph nodes: Secondary | ICD-10-CM

## 2020-05-19 DIAGNOSIS — N3001 Acute cystitis with hematuria: Secondary | ICD-10-CM | POA: Diagnosis not present

## 2020-05-19 LAB — POC URINALYSIS WITH MICROSCOPIC (NON AUTO)MANUAL RESULT
Bilirubin, UA: NEGATIVE
Crystals: 0
Glucose, UA: NEGATIVE
Ketones, UA: NEGATIVE
Mucus, UA: 0
Nitrite, UA: NEGATIVE
Protein, UA: NEGATIVE
RBC: 5 M/uL (ref 4.04–5.48)
Spec Grav, UA: <=1.005 — AB (ref 1.010–1.025)
Urobilinogen, UA: 0.2 E.U./dL
WBC Casts, UA: 30
pH, UA: 7.5 (ref 5.0–8.0)

## 2020-05-19 MED ORDER — CLINDAMYCIN PHOSPHATE 1 % EX GEL: Freq: Two times a day (BID) | CUTANEOUS | 0 refills | Status: DC

## 2020-05-19 MED ORDER — AMOXICILLIN-POT CLAVULANATE 875-125 MG PO TABS: 1.0000 | ORAL_TABLET | Freq: Two times a day (BID) | ORAL | 0 refills | Status: AC

## 2020-05-19 NOTE — Progress Notes (Signed)
Date:  05/19/2020   Name:  Isabel Miller   DOB:  March 11, 1970   MRN:  [id number]   Chief Complaint: Urinary Frequency (X1 days, UA freq/burning) and Mass (X3 days, small "ball"on throat you can feel/some painful, getting bigger in size )  Urinary Tract Infection  This is a new problem. The current episode started yesterday. The problem has been gradually worsening. The quality of the pain is described as burning. The pain is mild. There has been no fever. Associated symptoms include frequency and urgency. Pertinent negatives include no chills or hematuria. She has tried increased fluids for the symptoms. The treatment provided moderate relief.   Neck mass - noticed a few days ago.  Just under the chin, soft and mobile. No dental problems, sinus issue or ear pain.  No pain with swallow - just feels full.  Acne - still having cystic acne lesion on face.  They come and go. She feels like she is struggling with peri-menopausal issues and wants to take a Soy supplement.  Lab Results  Component Value Date   CREATININE 0.51 (L) 12/26/2019   BUN 7 12/26/2019   NA 141 12/26/2019   K 4.3 12/26/2019   CL 104 12/26/2019   CO2 21 12/26/2019   Lab Results  Component Value Date   CHOL 186 12/26/2019   HDL 43 12/26/2019   LDLCALC 121 (H) 12/26/2019   TRIG 124 12/26/2019   CHOLHDL 4.3 12/26/2019   Lab Results  Component Value Date   TSH 1.070 12/26/2019   No results found for: HGBA1C Lab Results  Component Value Date   WBC 7.6 12/26/2019   HGB 13.3 12/26/2019   HCT 39.9 12/26/2019   MCV 87 12/26/2019   PLT 324 12/26/2019   Lab Results  Component Value Date   ALT 29 12/26/2019   AST 19 12/26/2019   ALKPHOS 66 12/26/2019   BILITOT 0.3 12/26/2019     Review of Systems  Constitutional: Negative for chills, fatigue and fever.  Respiratory: Negative for chest tightness and shortness of breath.   Cardiovascular: Negative for chest pain.  Genitourinary: Positive  for frequency and urgency. Negative for hematuria.  Skin: Positive for rash.  Neurological: Negative for dizziness and headaches.    Patient Active Problem List   Diagnosis Date Noted  . Carpal tunnel syndrome of right wrist 04/05/2020  . S/P hysterectomy 12/26/2019  . Eczema 11/03/2019  . Edema of both lower legs 11/03/2019  . Bleeding external hemorrhoids 11/03/2019  . Mixed hyperlipidemia 01/23/2019  . Environmental and seasonal allergies 09/19/2017  . Chronic pain of both knees 09/19/2017  . Rash and nonspecific skin eruption 02/27/2017  . Mood disorder ([place]) 02/27/2017    Allergies  Allergen Reactions  . Ivp Dye [Iodinated Diagnostic Agents] Other (See Comments)    convulsion    Past Surgical History:  Procedure Laterality Date  . CESAREAN SECTION    . CHOLECYSTECTOMY    . COLONOSCOPY WITH PROPOFOL N/A 05/23/2019   Procedure: COLONOSCOPY WITH PROPOFOL;  Surgeon: Jonathon Bellows, MD;  [facility] ENDOSCOPY;  Service: Gastroenterology;  Laterality: N/A;  . HERNIA REPAIR    . PARTIAL HYSTERECTOMY     bleeding  . REDUCTION MAMMAPLASTY Bilateral 2010    Social History   Tobacco Use  . Smoking status: Never Smoker  . Smokeless tobacco: Never Used  Vaping Use  . Vaping Use: Never used  Substance Use Topics  . Alcohol use: Yes    Alcohol/week: 1.0  standard drink    Types: 1 Glasses of wine per week    Comment: occas  . Drug use: No     Medication list has been reviewed and updated.  Current Meds  Medication Sig  . escitalopram (LEXAPRO) 10 MG tablet Take 1 tablet (10 mg total) by mouth daily.  . Glucosamine HCl (GLUCOSAMINE PO) Take 1 tablet by mouth 3 (three) times a week.  . Multiple Vitamins-Minerals (MULTIVITAMIN WITH MINERALS) tablet Take 1 tablet by mouth daily.  . Multiple Vitamins-Minerals (ZINC PO) Take by mouth.  . pramoxine-hydrocortisone (PROCTOCREAM-HC) 1-1 % rectal cream Place 1 application rectally 2 (two) times daily.    PHQ 2/9 Scores  05/19/2020 04/05/2020 12/26/2019 11/03/2019  PHQ - 2 Score 2 4 2 3   PHQ- 9 Score 4 6 5 7     GAD 7 : Generalized Anxiety Score 05/19/2020 04/05/2020 12/26/2019  Nervous, Anxious, on Edge 0 1 0  Control/stop worrying 1 3 1   Worry too much - different things 1 3 1   Trouble relaxing 1 0 0  Restless 0 0 0  Easily annoyed or irritable 1 2 0  Afraid - awful might happen 0 0 0  Total GAD 7 Score 4 9 2     BP Readings from Last 3 Encounters:  05/19/20 126/84  04/08/20 (!) 149/89  04/05/20 120/84    Physical Exam Vitals and nursing note reviewed.  Constitutional:      General: She is not in acute distress.    Appearance: Normal appearance. She is well-developed.  HENT:     Head: Normocephalic and atraumatic.  Neck:      Comments: Soft mobile slightly tender non fluctuant mass - no redness or warmth Cardiovascular:     Rate and Rhythm: Normal rate and regular rhythm.     Pulses: Normal pulses.  Pulmonary:     Effort: Pulmonary effort is normal. No respiratory distress.     Breath sounds: No wheezing or rhonchi.  Lymphadenopathy:     Head:     Right side of head: Submental adenopathy present.     Left side of head: Submental adenopathy present.  Skin:    General: Skin is warm and dry.     Findings: Lesion present. No rash.     Comments: Half dozen facial lesions c/w mild cystic acne noted  Neurological:     Mental Status: She is alert and oriented to person, place, and time.  Psychiatric:        Mood and Affect: Mood normal.        Behavior: Behavior normal.     Wt Readings from Last 3 Encounters:  05/19/20 167 lb (75.8 kg)  04/08/20 164 lb (74.4 kg)  04/05/20 170 lb (77.1 kg)    BP 126/84   Pulse 86   Temp 98.4 F (36.9 C) (Oral)   Ht 5\' 1"  (1.549 m)   Wt 167 lb (75.8 kg)   SpO2 97%   BMI 31.55 kg/m   Assessment and Plan: 1. Acute cystitis with hematuria Continue increased fluids - amoxicillin-clavulanate (AUGMENTIN) 875-125 MG tablet; Take 1 tablet by mouth 2  (two) times daily for 10 days.  Dispense: 20 tablet; Refill: 0 - POC urinalysis w microscopic (non auto)  2. Enlarged submental lymph node Likely reactive without obvious cause Will complete course of Augmentin If unchanged, will refer to ENT  3. Acne vulgaris - clindamycin (CLINDAGEL) 1 % gel; Apply topically 2 (two) times daily. To face  Dispense: 30 g; Refill: 0  Partially dictated using Editor, commissioning. Any errors are unintentional.  Halina Maidens, MD [facility]  05/19/2020

## 2020-05-27 DIAGNOSIS — K648 Other hemorrhoids: Secondary | ICD-10-CM | POA: Diagnosis not present

## 2020-05-30 ENCOUNTER — Other Ambulatory Visit: Payer: Self-pay | Admitting: Surgery

## 2020-05-30 MED ORDER — LIDOCAINE (ANORECTAL) 5 % EX GEL: 1.0000 "application " | Freq: Four times a day (QID) | CUTANEOUS | 1 refills | Status: DC

## 2020-05-30 NOTE — Progress Notes (Signed)
05/30/20  Patient called reporting pain in the perianal area, s/p hemorrhoid banding with Dr. Windell Moment on 4/21.  She is trying the Percocet prescription and also the Proctofoam prescription she was given a month ago.  I have added a prescription for Lidocaine 5% ointment to be applied as needed.  Encouraged her to call Dr. Peyton Najjar Diaz's office tomorrow for follow up.  Olean Ree, MD

## 2020-06-04 MED ORDER — OXYCODONE-ACETAMINOPHEN 5-325 MG PO TABS: 1.0000 | ORAL_TABLET | ORAL | 0 refills | Status: DC | PRN

## 2020-06-24 ENCOUNTER — Ambulatory Visit: Payer: BC Managed Care – PPO | Admitting: Gastroenterology

## 2020-07-13 ENCOUNTER — Other Ambulatory Visit: Payer: Self-pay | Admitting: Internal Medicine

## 2020-07-13 DIAGNOSIS — L700 Acne vulgaris: Secondary | ICD-10-CM

## 2020-07-13 MED ORDER — CLINDAMYCIN PHOSPHATE 1 % EX GEL: Freq: Two times a day (BID) | CUTANEOUS | 0 refills | Status: DC

## 2020-07-13 NOTE — Telephone Encounter (Signed)
Medication Refill - Medication: clindamycin (CLINDAGEL) 1 % gel     Preferred Pharmacy (with phone number or street name): [id number] - [place], [place]: Please be advised that RX refills may take up to 3 business days. We ask that you follow-up with your pharmacy.

## 2020-07-13 NOTE — Telephone Encounter (Signed)
Requested medication (s) are due for refill today:   Yes  Requested medication (s) are on the active medication list:   Yes  Future visit scheduled:   Yes   Last ordered: 05/19/2020 30 g, 0 refills  Returned because there is not a protocol assigned to this medication.   Requested Prescriptions  Pending Prescriptions Disp Refills   clindamycin (CLINDAGEL) 1 % gel 30 g 0    Sig: Apply topically 2 (two) times daily. To face      Off-Protocol Failed - 07/13/2020  8:29 AM      Failed - Medication not assigned to a protocol, review manually.      Passed - Valid encounter within last 12 months    Recent Outpatient Visits           1 month ago Acute cystitis with hematuria   [facility] Glean Hess, MD   3 months ago Internal hemorrhoid, bleeding   [facility] Glean Hess, MD   6 months ago Annual physical exam   [facility] Glean Hess, MD   8 months ago Bleeding external hemorrhoids   [facility] Glean Hess, MD   1 year ago Acute cystitis without hematuria   [facility] Glean Hess, MD       Future Appointments             In 5 months Army Melia Jesse Sans, MD [facility], [facility]

## 2020-08-02 LAB — HEMOGLOBIN A1C: Hemoglobin A1C: 6.1

## 2020-10-29 ENCOUNTER — Ambulatory Visit: Payer: Self-pay

## 2020-10-29 NOTE — Telephone Encounter (Signed)
Reason for Disposition  [1] Redness of the skin AND [2] no fever  Answer Assessment - Initial Assessment Questions 1. ONSET: "When did the pain start?"      July 2. LOCATION: "Where is the pain located?"      Left foot 3. PAIN: "How bad is the pain?"    (Scale 1-10; or mild, moderate, severe)  - MILD (1-3): doesn't interfere with normal activities.   - MODERATE (4-7): interferes with normal activities (e.g., work or school) or awakens from sleep, limping.   - SEVERE (8-10): excruciating pain, unable to do any normal activities, unable to walk.      Now -mild 4. WORK OR EXERCISE: "Has there been any recent work or exercise that involved this part of the body?"      No 5. CAUSE: "What do you think is causing the foot pain?"     Cellulitis 6. OTHER SYMPTOMS: "Do you have any other symptoms?" (e.g., leg pain, rash, fever, numbness)     Mild swelling 7. PREGNANCY: "Is there any chance you are pregnant?" "When was your last menstrual period?"     No  Protocols used: Foot Pain-A-AH

## 2020-10-29 NOTE — Telephone Encounter (Signed)
Pt. Reports she had cellulitis in her left foot in July when [place] was [place]. Completely healed. Left foot is beginning to "swell a little and I'm afraid it's coming back." Declines UC. Asking to be worked in Monday afternoon if possible.Please advise pt.

## 2020-11-01 ENCOUNTER — Ambulatory Visit: Payer: BC Managed Care – PPO | Admitting: Internal Medicine

## 2020-11-05 ENCOUNTER — Ambulatory Visit: Payer: 59 | Admitting: Internal Medicine

## 2020-11-10 ENCOUNTER — Ambulatory Visit: Payer: 59 | Admitting: Internal Medicine

## 2020-11-11 ENCOUNTER — Encounter: Payer: Self-pay | Admitting: General Surgery

## 2020-11-17 ENCOUNTER — Encounter: Payer: Self-pay | Admitting: Internal Medicine

## 2020-11-17 ENCOUNTER — Other Ambulatory Visit: Payer: Self-pay

## 2020-11-17 ENCOUNTER — Ambulatory Visit: Payer: 59 | Admitting: Internal Medicine

## 2020-11-17 VITALS — BP 128/70 | HR 87 | Ht 61.0 in | Wt 164.2 lb

## 2020-11-17 DIAGNOSIS — Z23 Encounter for immunization: Secondary | ICD-10-CM

## 2020-11-17 DIAGNOSIS — L700 Acne vulgaris: Secondary | ICD-10-CM

## 2020-11-17 DIAGNOSIS — F39 Unspecified mood [affective] disorder: Secondary | ICD-10-CM | POA: Diagnosis not present

## 2020-11-17 DIAGNOSIS — R7303 Prediabetes: Secondary | ICD-10-CM | POA: Diagnosis not present

## 2020-11-17 MED ORDER — CLINDAMYCIN PHOSPHATE 1 % EX GEL: Freq: Two times a day (BID) | CUTANEOUS | 2 refills | Status: DC

## 2020-11-17 MED ORDER — ESCITALOPRAM OXALATE 5 MG PO TABS: 5.0000 mg | ORAL_TABLET | Freq: Every day | ORAL | 3 refills | Status: DC

## 2020-11-17 NOTE — Progress Notes (Signed)
Date:  11/17/2020   Name:  Isabel Miller   DOB:  1971-01-11   MRN:  [id number]   Chief Complaint: Edema  Diabetes She presents for her follow-up diabetic visit. Diabetes type: prediabetes. Hypoglycemia symptoms include nervousness/anxiousness (mil). Pertinent negatives for hypoglycemia include no dizziness or headaches. Associated symptoms include fatigue. Pertinent negatives for diabetes include no chest pain. (Abnormal labs during workup on vacation for cellulitis)  Depression        This is a recurrent problem.  The current episode started 1 to 4 weeks ago.   The problem occurs daily.  The problem has been gradually improving since onset.  Associated symptoms include fatigue.  Associated symptoms include no appetite change and no headaches.     The symptoms are aggravated by family issues, work stress and social issues (she may need to move again to continue working in the [place] - missing her family in [place].).  Past treatments include SSRIs - Selective serotonin reuptake inhibitors (resumed lexapro recently - woul like to continue). Cellulitis - she had a severe episode of cellulitis of her left foot during vacation to [place] in June.  She was treated with clindamycin then Levaquin.  She has had complete resolution of the infection.  Lab Results  Component Value Date   CREATININE 0.51 (L) 12/26/2019   BUN 7 12/26/2019   NA 141 12/26/2019   K 4.3 12/26/2019   CL 104 12/26/2019   CO2 21 12/26/2019   Lab Results  Component Value Date   CHOL 186 12/26/2019   HDL 43 12/26/2019   LDLCALC 121 (H) 12/26/2019   TRIG 124 12/26/2019   CHOLHDL 4.3 12/26/2019   Lab Results  Component Value Date   TSH 1.070 12/26/2019   Lab Results  Component Value Date   HGBA1C 6.1 08/02/2020   Lab Results  Component Value Date   WBC 7.6 12/26/2019   HGB 13.3 12/26/2019   HCT 39.9 12/26/2019   MCV 87 12/26/2019   PLT 324 12/26/2019   Lab Results  Component Value  Date   ALT 29 12/26/2019   AST 19 12/26/2019   ALKPHOS 66 12/26/2019   BILITOT 0.3 12/26/2019     Review of Systems  Constitutional:  Positive for fatigue. Negative for appetite change, chills and fever.  Respiratory:  Negative for cough, chest tightness and shortness of breath.   Cardiovascular:  Negative for chest pain and leg swelling.  Neurological:  Negative for dizziness and headaches.  Psychiatric/Behavioral:  Positive for depression and dysphoric mood. Negative for sleep disturbance. The patient is nervous/anxious (mil).    Patient Active Problem List   Diagnosis Date Noted   Prediabetes 11/17/2020   Acne vulgaris 05/19/2020   Enlarged submental lymph node 05/19/2020   Carpal tunnel syndrome of right wrist 04/05/2020   S/P hysterectomy 12/26/2019   Eczema 11/03/2019   Edema of both lower legs 11/03/2019   Bleeding external hemorrhoids 11/03/2019   Mixed hyperlipidemia 01/23/2019   Environmental and seasonal allergies 09/19/2017   Chronic pain of both knees 09/19/2017   Rash and nonspecific skin eruption 02/27/2017   Mood disorder ([place]) 02/27/2017    Allergies  Allergen Reactions   Ivp Dye [Iodinated Diagnostic Agents] Other (See Comments)    convulsion    Past Surgical History:  Procedure Laterality Date   CESAREAN SECTION     CHOLECYSTECTOMY     COLONOSCOPY WITH PROPOFOL N/A 05/23/2019   Procedure: COLONOSCOPY WITH PROPOFOL;  Surgeon: Jonathon Bellows,  MD;  Location: [facility] ENDOSCOPY;  Service: Gastroenterology;  Laterality: N/A;   HERNIA REPAIR     PARTIAL HYSTERECTOMY     bleeding   REDUCTION MAMMAPLASTY Bilateral 2010    Social History   Tobacco Use   Smoking status: Never   Smokeless tobacco: Never  Vaping Use   Vaping Use: Never used  Substance Use Topics   Alcohol use: Yes    Alcohol/week: 1.0 standard drink    Types: 1 Glasses of wine per week    Comment: occas   Drug use: No     Medication list has been reviewed and updated.  Current Meds   Medication Sig   clindamycin (CLINDAGEL) 1 % gel Apply topically 2 (two) times daily. To face   escitalopram (LEXAPRO) 5 MG tablet Take 1 tablet (5 mg total) by mouth daily.   Glucosamine HCl (GLUCOSAMINE PO) Take 1 tablet by mouth 3 (three) times a week.   Lidocaine, Anorectal, 5 % GEL Apply 1 application topically in the morning, at noon, in the evening, and at bedtime.   Multiple Vitamins-Minerals (MULTIVITAMIN WITH MINERALS) tablet Take 1 tablet by mouth daily.   Multiple Vitamins-Minerals (ZINC PO) Take by mouth.   pramoxine-hydrocortisone (PROCTOCREAM-HC) 1-1 % rectal cream Place 1 application rectally 2 (two) times daily.    PHQ 2/9 Scores 11/17/2020 05/19/2020 04/05/2020 12/26/2019  PHQ - 2 Score 2 2 4 2   PHQ- 9 Score 4 4 6 5     GAD 7 : Generalized Anxiety Score 11/17/2020 05/19/2020 04/05/2020 12/26/2019  Nervous, Anxious, on Edge 1 0 1 0  Control/stop worrying 1 1 3 1   Worry too much - different things 1 1 3 1   Trouble relaxing 1 1 0 0  Restless 1 0 0 0  Easily annoyed or irritable 1 1 2  0  Afraid - awful might happen 1 0 0 0  Total GAD 7 Score 7 4 9 2   Anxiety Difficulty Not difficult at all - - -    BP Readings from Last 3 Encounters:  11/17/20 128/70  05/19/20 126/84  04/08/20 (!) 149/89    Physical Exam Vitals and nursing note reviewed.  Constitutional:      General: She is not in acute distress.    Appearance: Normal appearance. She is well-developed.  HENT:     Head: Normocephalic and atraumatic.  Cardiovascular:     Rate and Rhythm: Normal rate and regular rhythm.     Heart sounds: No murmur heard. Pulmonary:     Effort: Pulmonary effort is normal. No respiratory distress.     Breath sounds: No wheezing or rhonchi.  Musculoskeletal:     Cervical back: Normal range of motion.     Right lower leg: No edema.     Left lower leg: No edema.  Lymphadenopathy:     Cervical: No cervical adenopathy.  Skin:    General: Skin is warm and dry.     Capillary  Refill: Capillary refill takes less than 2 seconds.     Findings: No rash.  Neurological:     General: No focal deficit present.     Mental Status: She is alert and oriented to person, place, and time.  Psychiatric:        Mood and Affect: Mood normal.        Behavior: Behavior normal.    Wt Readings from Last 3 Encounters:  11/17/20 164 lb 3.2 oz (74.5 kg)  05/19/20 167 lb (75.8 kg)  04/08/20 164 lb (74.4 kg)  BP 128/70   Pulse 87   Ht 5\' 1"  (1.549 m)   Wt 164 lb 3.2 oz (74.5 kg)   SpO2 97%   BMI 31.03 kg/m   Assessment and Plan: 1. Prediabetes Will recheck labs and advise Work on low carb diet (has already changed some eating habits) and weight loss. - Hemoglobin A1c - CBC with Differential/Platelet - Comprehensive metabolic panel 2. Acne vulgaris - clindamycin (CLINDAGEL) 1 % gel; Apply topically 2 (two) times daily. To face  Dispense: 30 g; Refill: 2  3. Mood disorder ([place]) Recent stress with work, Mudlogger situation, etc Recommend continuing Lexapro 5 mg - follow up if not improving. Suspect fatigue is related to depression rather than a physical condition - screening labs are pending - escitalopram (LEXAPRO) 5 MG tablet; Take 1 tablet (5 mg total) by mouth daily.  Dispense: 30 tablet; Refill: 3 - TSH   Partially dictated using Editor, commissioning. Any errors are unintentional.  Halina Maidens, MD [facility]  11/17/2020

## 2020-11-17 NOTE — Progress Notes (Signed)
Date:  11/17/2020   Name:  Isabel Miller   DOB:  May 12, 1970   MRN:  [id number]   Chief Complaint: Edema  Patient here today complaining of left foot edema after a scratch on the top of the left foot back in June 2022. Foot was swollen, purple, and extremely painful. She seen the [place] in [place] country, and brought records from this visit. She was diagnosed with cellulitis.Today patient has no swelling noted, or pain.    Lab Results  Component Value Date   CREATININE 0.51 (L) 12/26/2019   BUN 7 12/26/2019   NA 141 12/26/2019   K 4.3 12/26/2019   CL 104 12/26/2019   CO2 21 12/26/2019   Lab Results  Component Value Date   CHOL 186 12/26/2019   HDL 43 12/26/2019   LDLCALC 121 (H) 12/26/2019   TRIG 124 12/26/2019   CHOLHDL 4.3 12/26/2019   Lab Results  Component Value Date   TSH 1.070 12/26/2019   No results found for: HGBA1C Lab Results  Component Value Date   WBC 7.6 12/26/2019   HGB 13.3 12/26/2019   HCT 39.9 12/26/2019   MCV 87 12/26/2019   PLT 324 12/26/2019   Lab Results  Component Value Date   ALT 29 12/26/2019   AST 19 12/26/2019   ALKPHOS 66 12/26/2019   BILITOT 0.3 12/26/2019     Review of Systems  Patient Active Problem List   Diagnosis Date Noted   Acne vulgaris 05/19/2020   Enlarged submental lymph node 05/19/2020   Carpal tunnel syndrome of right wrist 04/05/2020   S/P hysterectomy 12/26/2019   Eczema 11/03/2019   Edema of both lower legs 11/03/2019   Bleeding external hemorrhoids 11/03/2019   Mixed hyperlipidemia 01/23/2019   Environmental and seasonal allergies 09/19/2017   Chronic pain of both knees 09/19/2017   Rash and nonspecific skin eruption 02/27/2017   Mood disorder ([place]) 02/27/2017    Allergies  Allergen Reactions   Ivp Dye [Iodinated Diagnostic Agents] Other (See Comments)    convulsion    Past Surgical History:  Procedure Laterality Date   CESAREAN SECTION     CHOLECYSTECTOMY     COLONOSCOPY WITH  PROPOFOL N/A 05/23/2019   Procedure: COLONOSCOPY WITH PROPOFOL;  Surgeon: Jonathon Bellows, MD;  Location: [facility] ENDOSCOPY;  Service: Gastroenterology;  Laterality: N/A;   HERNIA REPAIR     PARTIAL HYSTERECTOMY     bleeding   REDUCTION MAMMAPLASTY Bilateral 2010    Social History   Tobacco Use   Smoking status: Never   Smokeless tobacco: Never  Vaping Use   Vaping Use: Never used  Substance Use Topics   Alcohol use: Yes    Alcohol/week: 1.0 standard drink    Types: 1 Glasses of wine per week    Comment: occas   Drug use: No     Medication list has been reviewed and updated.  No outpatient medications have been marked as taking for the 11/17/20 encounter (Office Visit) with Glean Hess, MD.    [facility] 2/9 Scores 05/19/2020 04/05/2020 12/26/2019 11/03/2019  PHQ - 2 Score 2 4 2 3   PHQ- 9 Score 4 6 5 7     GAD 7 : Generalized Anxiety Score 05/19/2020 04/05/2020 12/26/2019  Nervous, Anxious, on Edge 0 1 0  Control/stop worrying 1 3 1   Worry too much - different things 1 3 1   Trouble relaxing 1 0 0  Restless 0 0 0  Easily annoyed or irritable 1 2  0  Afraid - awful might happen 0 0 0  Total GAD 7 Score 4 9 2     BP Readings from Last 3 Encounters:  05/19/20 126/84  04/08/20 (!) 149/89  04/05/20 120/84    Physical Exam  Wt Readings from Last 3 Encounters:  05/19/20 167 lb (75.8 kg)  04/08/20 164 lb (74.4 kg)  04/05/20 170 lb (77.1 kg)    There were no vitals taken for this visit.  Assessment and Plan:

## 2020-11-18 LAB — CBC WITH DIFFERENTIAL/PLATELET
Basophils Absolute: 0.1 10*3/uL (ref 0.0–0.2)
Basos: 1 %
EOS (ABSOLUTE): 0.2 10*3/uL (ref 0.0–0.4)
Eos: 2 %
Hematocrit: 41.1 % (ref 34.0–46.6)
Hemoglobin: 13.9 g/dL (ref 11.1–15.9)
Immature Grans (Abs): 0 10*3/uL (ref 0.0–0.1)
Immature Granulocytes: 0 %
Lymphocytes Absolute: 2.4 10*3/uL (ref 0.7–3.1)
Lymphs: 28 %
MCH: 28.5 pg (ref 26.6–33.0)
MCHC: 33.8 g/dL (ref 31.5–35.7)
MCV: 84 fL (ref 79–97)
Monocytes Absolute: 0.7 10*3/uL (ref 0.1–0.9)
Monocytes: 8 %
Neutrophils Absolute: 5.2 10*3/uL (ref 1.4–7.0)
Neutrophils: 61 %
Platelets: 297 10*3/uL (ref 150–450)
RBC: 4.87 x10E6/uL (ref 3.77–5.28)
RDW: 13.7 % (ref 11.7–15.4)
WBC: 8.5 10*3/uL (ref 3.4–10.8)

## 2020-11-18 LAB — COMPREHENSIVE METABOLIC PANEL
ALT: 32 IU/L (ref 0–32)
AST: 21 IU/L (ref 0–40)
Albumin/Globulin Ratio: 1.7 (ref 1.2–2.2)
Albumin: 5 g/dL — ABNORMAL HIGH (ref 3.8–4.9)
Alkaline Phosphatase: 89 IU/L (ref 44–121)
BUN/Creatinine Ratio: 19 (ref 9–23)
BUN: 9 mg/dL (ref 6–24)
Bilirubin Total: 0.2 mg/dL (ref 0.0–1.2)
CO2: 21 mmol/L (ref 20–29)
Calcium: 9.8 mg/dL (ref 8.7–10.2)
Chloride: 99 mmol/L (ref 96–106)
Creatinine, Ser: 0.48 mg/dL — ABNORMAL LOW (ref 0.57–1.00)
Globulin, Total: 3 g/dL (ref 1.5–4.5)
Glucose: 78 mg/dL (ref 70–99)
Potassium: 4.4 mmol/L (ref 3.5–5.2)
Sodium: 138 mmol/L (ref 134–144)
Total Protein: 8 g/dL (ref 6.0–8.5)
eGFR: 115 mL/min/{1.73_m2} (ref >59)

## 2020-11-18 LAB — TSH: TSH: 0.802 u[IU]/mL (ref 0.450–4.500)

## 2020-11-18 LAB — HEMOGLOBIN A1C
Est. average glucose Bld gHb Est-mCnc: 128 mg/dL
Hgb A1c MFr Bld: 6.1 % — ABNORMAL HIGH (ref 4.8–5.6)

## 2020-11-30 ENCOUNTER — Other Ambulatory Visit: Payer: Self-pay

## 2020-11-30 ENCOUNTER — Encounter: Payer: Self-pay | Admitting: Internal Medicine

## 2020-11-30 ENCOUNTER — Ambulatory Visit: Payer: 59 | Admitting: Internal Medicine

## 2020-11-30 VITALS — BP 130/84 | HR 88 | Temp 98.6°F | Ht 61.0 in | Wt 161.0 lb

## 2020-11-30 DIAGNOSIS — J069 Acute upper respiratory infection, unspecified: Secondary | ICD-10-CM

## 2020-11-30 NOTE — Progress Notes (Signed)
Date:  11/30/2020   Name:  Isabel Miller   DOB:  1970/03/15   MRN:  [id number]   Chief Complaint: Sore Throat  Sore Throat  This is a new problem. The current episode started in the past 7 days. The problem has been waxing and waning. Neither side of throat is experiencing more pain than the other. There has been no fever. Associated symptoms include congestion, coughing, headaches, a hoarse voice, swollen glands and trouble swallowing. Pertinent negatives include no diarrhea or shortness of breath.   Lab Results  Component Value Date   CREATININE 0.48 (L) 11/17/2020   BUN 9 11/17/2020   NA 138 11/17/2020   K 4.4 11/17/2020   CL 99 11/17/2020   CO2 21 11/17/2020   Lab Results  Component Value Date   CHOL 186 12/26/2019   HDL 43 12/26/2019   LDLCALC 121 (H) 12/26/2019   TRIG 124 12/26/2019   CHOLHDL 4.3 12/26/2019   Lab Results  Component Value Date   TSH 0.802 11/17/2020   Lab Results  Component Value Date   HGBA1C 6.1 (H) 11/17/2020   Lab Results  Component Value Date   WBC 8.5 11/17/2020   HGB 13.9 11/17/2020   HCT 41.1 11/17/2020   MCV 84 11/17/2020   PLT 297 11/17/2020   Lab Results  Component Value Date   ALT 32 11/17/2020   AST 21 11/17/2020   ALKPHOS 89 11/17/2020   BILITOT 0.2 11/17/2020     Review of Systems  Constitutional:  Positive for diaphoresis. Negative for chills, fatigue and fever.  HENT:  Positive for congestion, hoarse voice, postnasal drip, sore throat and trouble swallowing. Negative for sinus pressure and sinus pain.   Respiratory:  Positive for cough. Negative for chest tightness, shortness of breath and wheezing.   Cardiovascular:  Negative for chest pain and palpitations.  Gastrointestinal:  Negative for diarrhea.  Neurological:  Positive for headaches. Negative for dizziness.  Psychiatric/Behavioral:  Negative for dysphoric mood. The patient is not nervous/anxious.    Patient Active Problem List    Diagnosis Date Noted   Prediabetes 11/17/2020   Acne vulgaris 05/19/2020   Enlarged submental lymph node 05/19/2020   Carpal tunnel syndrome of right wrist 04/05/2020   S/P hysterectomy 12/26/2019   Eczema 11/03/2019   Edema of both lower legs 11/03/2019   Bleeding external hemorrhoids 11/03/2019   Mixed hyperlipidemia 01/23/2019   Environmental and seasonal allergies 09/19/2017   Chronic pain of both knees 09/19/2017   Rash and nonspecific skin eruption 02/27/2017   Mood disorder ([place]) 02/27/2017    Allergies  Allergen Reactions   Ivp Dye [Iodinated Diagnostic Agents] Other (See Comments)    convulsion    Past Surgical History:  Procedure Laterality Date   CESAREAN SECTION     CHOLECYSTECTOMY     COLONOSCOPY WITH PROPOFOL N/A 05/23/2019   Procedure: COLONOSCOPY WITH PROPOFOL;  Surgeon: Jonathon Bellows, MD;  Location: [facility] ENDOSCOPY;  Service: Gastroenterology;  Laterality: N/A;   HERNIA REPAIR     PARTIAL HYSTERECTOMY     bleeding   REDUCTION MAMMAPLASTY Bilateral 2010    Social History   Tobacco Use   Smoking status: Never   Smokeless tobacco: Never  Vaping Use   Vaping Use: Never used  Substance Use Topics   Alcohol use: Yes    Alcohol/week: 1.0 standard drink    Types: 1 Glasses of wine per week    Comment: occas   Drug use: No  Medication list has been reviewed and updated.  Current Meds  Medication Sig   clindamycin (CLINDAGEL) 1 % gel Apply topically 2 (two) times daily. To face   escitalopram (LEXAPRO) 5 MG tablet Take 1 tablet (5 mg total) by mouth daily.   Glucosamine HCl (GLUCOSAMINE PO) Take 1 tablet by mouth 3 (three) times a week.   Lidocaine, Anorectal, 5 % GEL Apply 1 application topically in the morning, at noon, in the evening, and at bedtime.   Multiple Vitamins-Minerals (MULTIVITAMIN WITH MINERALS) tablet Take 1 tablet by mouth daily.   Multiple Vitamins-Minerals (ZINC PO) Take by mouth.   pramoxine-hydrocortisone (PROCTOCREAM-HC) 1-1 %  rectal cream Place 1 application rectally 2 (two) times daily.    PHQ 2/9 Scores 11/30/2020 11/17/2020 05/19/2020 04/05/2020  PHQ - 2 Score 1 2 2 4   PHQ- 9 Score 10 4 4 6     GAD 7 : Generalized Anxiety Score 11/30/2020 11/17/2020 05/19/2020 04/05/2020  Nervous, Anxious, on Edge 1 1 0 1  Control/stop worrying 1 1 1 3   Worry too much - different things 2 1 1 3   Trouble relaxing 2 1 1  0  Restless 1 1 0 0  Easily annoyed or irritable 1 1 1 2   Afraid - awful might happen 1 1 0 0  Total GAD 7 Score 9 7 4 9   Anxiety Difficulty Not difficult at all Not difficult at all - -    BP Readings from Last 3 Encounters:  11/30/20 130/84  11/17/20 128/70  05/19/20 126/84    Physical Exam Constitutional:      Appearance: She is well-developed.  HENT:     Right Ear: Tympanic membrane and ear canal normal.     Left Ear: Tympanic membrane and ear canal normal.     Nose:     Right Sinus: No maxillary sinus tenderness.     Left Sinus: No maxillary sinus tenderness.     Mouth/Throat:     Pharynx: Posterior oropharyngeal erythema present. No oropharyngeal exudate.     Comments: Voice hoarse  Eyes:     Conjunctiva/sclera: Conjunctivae normal.  Cardiovascular:     Rate and Rhythm: Normal rate and regular rhythm.  Pulmonary:     Effort: Pulmonary effort is normal.     Breath sounds: Normal breath sounds. No wheezing or rhonchi.  Musculoskeletal:     Cervical back: Normal range of motion.  Lymphadenopathy:     Cervical: No cervical adenopathy.  Skin:    General: Skin is warm and dry.  Neurological:     Mental Status: She is alert.    Wt Readings from Last 3 Encounters:  11/30/20 161 lb (73 kg)  11/17/20 164 lb 3.2 oz (74.5 kg)  05/19/20 167 lb (75.8 kg)    BP 130/84   Pulse 88   Temp 98.6 F (37 C) (Oral)   Ht 5\' 1"  (1.549 m)   Wt 161 lb (73 kg)   SpO2 99%   BMI 30.42 kg/m   Assessment and Plan: 1. Viral URI with cough Recommend out of work tomorrow for voice rest Claritin 10  mg daily Delsym as needed for cough.   Partially dictated using Editor, commissioning. Any errors are unintentional.  Halina Maidens, MD [facility]  11/30/2020

## 2020-11-30 NOTE — Patient Instructions (Signed)
Delsym cough syrup - good over the counter  Claritin 10 mg (loratadine) once a day for post nasal drainage and sore throat

## 2020-12-29 ENCOUNTER — Other Ambulatory Visit (HOSPITAL_COMMUNITY)
Admission: RE | Admit: 2020-12-29 | Discharge: 2020-12-29 | Disposition: A | Discharge status: Home / Self Care | Payer: 59 | Source: Ambulatory Visit | Attending: Internal Medicine | Admitting: Internal Medicine

## 2020-12-29 ENCOUNTER — Other Ambulatory Visit: Payer: Self-pay

## 2020-12-29 ENCOUNTER — Ambulatory Visit (INDEPENDENT_AMBULATORY_CARE_PROVIDER_SITE_OTHER): Payer: 59 | Admitting: Internal Medicine

## 2020-12-29 ENCOUNTER — Encounter: Payer: Self-pay | Admitting: Internal Medicine

## 2020-12-29 VITALS — BP 138/78 | HR 75 | Ht 61.0 in | Wt 158.8 lb

## 2020-12-29 DIAGNOSIS — Z124 Encounter for screening for malignant neoplasm of cervix: Secondary | ICD-10-CM | POA: Diagnosis present

## 2020-12-29 DIAGNOSIS — Z0000 Encounter for general adult medical examination without abnormal findings: Secondary | ICD-10-CM | POA: Diagnosis not present

## 2020-12-29 DIAGNOSIS — E782 Mixed hyperlipidemia: Secondary | ICD-10-CM | POA: Diagnosis not present

## 2020-12-29 DIAGNOSIS — F39 Unspecified mood [affective] disorder: Secondary | ICD-10-CM

## 2020-12-29 DIAGNOSIS — Z1231 Encounter for screening mammogram for malignant neoplasm of breast: Secondary | ICD-10-CM | POA: Diagnosis not present

## 2020-12-29 DIAGNOSIS — R7303 Prediabetes: Secondary | ICD-10-CM | POA: Diagnosis not present

## 2020-12-29 MED ORDER — ESCITALOPRAM OXALATE 5 MG PO TABS: 5.0000 mg | ORAL_TABLET | Freq: Every day | ORAL | 3 refills | Status: DC

## 2020-12-29 NOTE — Progress Notes (Signed)
Date:  12/29/2020   Name:  Isabel Miller   DOB:  03-Nov-1970   MRN:  [id number]   Chief Complaint: Annual Exam (Breast Exam. Pap smear. ) Lesia Hausen Del Ortencia Askari is a 50 y.o. female who presents today for her Complete Annual Exam. She feels well. She reports exercising. She reports she is sleeping well. Breast complaints - none.  Mammogram: 02/2020 DEXA: none Pap smear: discontinued Colonoscopy: 05/2019 repeat 5 yrs  Immunization History  Administered Date(s) Administered   Influenza,inj,Quad PF,6+ Mos 12/20/2017, 10/26/2018, 11/17/2020   Influenza-Unspecified 11/13/2019   PFIZER(Purple Top)SARS-COV-2 Vaccination 04/06/2019, 04/28/2019, 11/11/2019   PPD Test 09/08/2016    HPI  Lab Results  Component Value Date   CREATININE 0.48 (L) 11/17/2020   BUN 9 11/17/2020   NA 138 11/17/2020   K 4.4 11/17/2020   CL 99 11/17/2020   CO2 21 11/17/2020   Lab Results  Component Value Date   CHOL 186 12/26/2019   HDL 43 12/26/2019   LDLCALC 121 (H) 12/26/2019   TRIG 124 12/26/2019   CHOLHDL 4.3 12/26/2019   Lab Results  Component Value Date   TSH 0.802 11/17/2020   Lab Results  Component Value Date   HGBA1C 6.1 (H) 11/17/2020   Lab Results  Component Value Date   WBC 8.5 11/17/2020   HGB 13.9 11/17/2020   HCT 41.1 11/17/2020   MCV 84 11/17/2020   PLT 297 11/17/2020   Lab Results  Component Value Date   ALT 32 11/17/2020   AST 21 11/17/2020   ALKPHOS 89 11/17/2020   BILITOT 0.2 11/17/2020   No components found for: VITD  Review of Systems  Constitutional:  Negative for chills, fatigue and fever.  HENT:  Negative for congestion, hearing loss, tinnitus, trouble swallowing and voice change.   Eyes:  Negative for visual disturbance.  Respiratory:  Positive for cough. Negative for chest tightness, shortness of breath and wheezing.   Cardiovascular:  Negative for chest pain, palpitations and leg swelling.  Gastrointestinal:  Negative for  abdominal pain, constipation, diarrhea and vomiting.  Endocrine: Negative for polydipsia and polyuria.  Genitourinary:  Negative for dysuria, frequency, genital sores, vaginal bleeding and vaginal discharge.  Musculoskeletal:  Negative for arthralgias, gait problem and joint swelling.  Skin:  Positive for color change (dark on left foot s/p cellulitis). Negative for rash.  Neurological:  Negative for dizziness, tremors, light-headedness and headaches.  Hematological:  Negative for adenopathy. Does not bruise/bleed easily.  Psychiatric/Behavioral:  Negative for dysphoric mood and sleep disturbance. The patient is not nervous/anxious.    Patient Active Problem List   Diagnosis Date Noted   Prediabetes 11/17/2020   Acne vulgaris 05/19/2020   Enlarged submental lymph node 05/19/2020   Carpal tunnel syndrome of right wrist 04/05/2020   S/P hysterectomy 12/26/2019   Eczema 11/03/2019   Edema of both lower legs 11/03/2019   Bleeding external hemorrhoids 11/03/2019   Mixed hyperlipidemia 01/23/2019   Environmental and seasonal allergies 09/19/2017   Chronic pain of both knees 09/19/2017   Rash and nonspecific skin eruption 02/27/2017   Mood disorder ([place]) 02/27/2017    Allergies  Allergen Reactions   Ivp Dye [Iodinated Diagnostic Agents] Other (See Comments)    convulsion    Past Surgical History:  Procedure Laterality Date   CESAREAN SECTION     CHOLECYSTECTOMY     COLONOSCOPY WITH PROPOFOL N/A 05/23/2019   Procedure: COLONOSCOPY WITH PROPOFOL;  Surgeon: Jonathon Bellows, MD;  Location: [facility] ENDOSCOPY;  Service: Gastroenterology;  Laterality: N/A;   HERNIA REPAIR     PARTIAL HYSTERECTOMY     bleeding   REDUCTION MAMMAPLASTY Bilateral 2010    Social History   Tobacco Use   Smoking status: Never   Smokeless tobacco: Never  Vaping Use   Vaping Use: Never used  Substance Use Topics   Alcohol use: Yes    Alcohol/week: 1.0 standard drink    Types: 1 Glasses of wine per week     Comment: occas   Drug use: No     Medication list has been reviewed and updated.  Current Meds  Medication Sig   clindamycin (CLINDAGEL) 1 % gel Apply topically 2 (two) times daily. To face   escitalopram (LEXAPRO) 5 MG tablet Take 1 tablet (5 mg total) by mouth daily.   Glucosamine HCl (GLUCOSAMINE PO) Take 1 tablet by mouth 3 (three) times a week.   Lidocaine, Anorectal, 5 % GEL Apply 1 application topically in the morning, at noon, in the evening, and at bedtime.   Multiple Vitamins-Minerals (MULTIVITAMIN WITH MINERALS) tablet Take 1 tablet by mouth daily.   Multiple Vitamins-Minerals (ZINC PO) Take by mouth.   oxyCODONE-acetaminophen (PERCOCET) 5-325 MG tablet Take 1 tablet by mouth every 4 (four) hours as needed for severe pain.   pramoxine-hydrocortisone (PROCTOCREAM-HC) 1-1 % rectal cream Place 1 application rectally 2 (two) times daily.    PHQ 2/9 Scores 12/29/2020 11/30/2020 11/17/2020 05/19/2020  PHQ - 2 Score 2 1 2 2   PHQ- 9 Score 3 10 4 4     GAD 7 : Generalized Anxiety Score 11/30/2020 11/17/2020 05/19/2020 04/05/2020  Nervous, Anxious, on Edge 1 1 0 1  Control/stop worrying 1 1 1 3   Worry too much - different things 2 1 1 3   Trouble relaxing 2 1 1  0  Restless 1 1 0 0  Easily annoyed or irritable 1 1 1 2   Afraid - awful might happen 1 1 0 0  Total GAD 7 Score 9 7 4 9   Anxiety Difficulty Not difficult at all Not difficult at all - -    BP Readings from Last 3 Encounters:  12/29/20 138/78  11/30/20 130/84  11/17/20 128/70    Physical Exam Vitals and nursing note reviewed.  Constitutional:      General: She is not in acute distress.    Appearance: She is well-developed.  HENT:     Head: Normocephalic and atraumatic.     Right Ear: Tympanic membrane and ear canal normal.     Left Ear: Tympanic membrane and ear canal normal.     Nose:     Right Sinus: No maxillary sinus tenderness.     Left Sinus: No maxillary sinus tenderness.  Eyes:     General: No scleral  icterus.       Right eye: No discharge.        Left eye: No discharge.     Conjunctiva/sclera: Conjunctivae normal.  Neck:     Thyroid: No thyromegaly.     Vascular: No carotid bruit.  Cardiovascular:     Rate and Rhythm: Normal rate and regular rhythm.     Pulses: Normal pulses.     Heart sounds: Normal heart sounds.  Pulmonary:     Effort: Pulmonary effort is normal. No respiratory distress.     Breath sounds: No wheezing.  Chest:  Breasts:    Right: No mass, nipple discharge, skin change or tenderness.     Left: No mass, nipple discharge, skin  change or tenderness.  Abdominal:     General: Bowel sounds are normal.     Palpations: Abdomen is soft.     Tenderness: There is no abdominal tenderness.  Genitourinary:    Labia:        Right: No rash, tenderness or lesion.        Left: No rash, tenderness or lesion.      Vagina: Normal.     Uterus: Absent.      Adnexa: Right adnexa normal and left adnexa normal.     Comments: Cervix not visualized - Pap obtained from the vaginal cuff Cervix not palpated on bi-manual exam Musculoskeletal:        General: Normal range of motion.     Cervical back: Normal range of motion. No erythema.     Right lower leg: No edema.     Left lower leg: No edema.  Lymphadenopathy:     Cervical: No cervical adenopathy.  Skin:    General: Skin is warm and dry.     Capillary Refill: Capillary refill takes less than 2 seconds.     Findings: No rash.          Comments: Vaguely hyperpigmented top of left foot  Neurological:     General: No focal deficit present.     Mental Status: She is alert and oriented to person, place, and time.     Cranial Nerves: No cranial nerve deficit.     Sensory: No sensory deficit.     Deep Tendon Reflexes: Reflexes are normal and symmetric.  Psychiatric:        Attention and Perception: Attention normal.        Mood and Affect: Mood normal.    Wt Readings from Last 3 Encounters:  12/29/20 158 lb 12.8 oz (72 kg)   11/30/20 161 lb (73 kg)  11/17/20 164 lb 3.2 oz (74.5 kg)    BP 138/78   Pulse 75   Ht 5\' 1"  (1.549 m)   Wt 158 lb 12.8 oz (72 kg)   SpO2 98%   BMI 30.00 kg/m   Assessment and Plan: 1. Annual physical exam Normal exam, excellent weight loss with diet/exercise Up to date on screenings and immunizations.  2. Encounter for screening mammogram for breast cancer Schedule in January - MM 3D SCREEN BREAST BILATERAL  3. Mixed hyperlipidemia Check labs and advise - Lipid panel  4. Prediabetes Recent A1C 6.1 and stable. Continue diet and exercise  5. Encounter for screening for cervical cancer Pap obtained from vaginal cuff  - Cytology - PAP  6. Mood disorder ([place]) Did not get medication last month so will re-send Rx. - escitalopram (LEXAPRO) 5 MG tablet; Take 1 tablet (5 mg total) by mouth daily.  Dispense: 30 tablet; Refill: 3   Partially dictated using Editor, commissioning. Any errors are unintentional.  Halina Maidens, MD [facility]  12/29/2020

## 2020-12-30 LAB — LIPID PANEL
Chol/HDL Ratio: 5.2 ratio — ABNORMAL HIGH (ref 0.0–4.4)
Cholesterol, Total: 214 mg/dL — ABNORMAL HIGH (ref 100–199)
HDL: 41 mg/dL (ref >39)
LDL Chol Calc (NIH): 138 mg/dL — ABNORMAL HIGH (ref 0–99)
Triglycerides: 197 mg/dL — ABNORMAL HIGH (ref 0–149)
VLDL Cholesterol Cal: 35 mg/dL (ref 5–40)

## 2021-01-04 LAB — CYTOLOGY - PAP
Adequacy: ABSENT
Comment: NEGATIVE
Diagnosis: NEGATIVE
High risk HPV: NEGATIVE

## 2021-02-14 ENCOUNTER — Other Ambulatory Visit: Payer: Self-pay | Admitting: Internal Medicine

## 2021-02-14 ENCOUNTER — Ambulatory Visit
Admission: RE | Admit: 2021-02-14 | Discharge: 2021-02-14 | Disposition: A | Discharge status: Home / Self Care | Payer: 59 | Source: Ambulatory Visit | Attending: Internal Medicine | Admitting: Internal Medicine

## 2021-02-14 ENCOUNTER — Other Ambulatory Visit: Payer: Self-pay

## 2021-02-14 DIAGNOSIS — L700 Acne vulgaris: Secondary | ICD-10-CM

## 2021-02-14 DIAGNOSIS — Z1231 Encounter for screening mammogram for malignant neoplasm of breast: Secondary | ICD-10-CM | POA: Insufficient documentation

## 2021-02-14 NOTE — Telephone Encounter (Signed)
Medication Refill - Medication: clindamycin (CLINDAGEL) 1 % gel  Has the patient contacted their pharmacy? No. (Agent: If no, request that the patient contact the pharmacy for the refill. If patient does not wish to contact the pharmacy document the reason why and proceed with request.) Says pcp needs to refill prescription   Preferred Pharmacy (with phone number or street name):  [facility] #[id number] [place] [place] [address] AT [address] [place] [address] Phone: [phone] Fax: [phone] Has the patient been seen for an appointment in the last year OR does the patient have an upcoming appointment? Yes.    Agent: Please be advised that RX refills may take up to 3 business days. We ask that you follow-up with your pharmacy.

## 2021-02-15 MED ORDER — CLINDAMYCIN PHOSPHATE 1 % EX GEL: Freq: Two times a day (BID) | CUTANEOUS | 2 refills | Status: AC

## 2021-02-15 NOTE — Telephone Encounter (Signed)
Requested medication (s) are on the active medication list: yes  Future visit scheduled: yes 01/03/22  Notes to clinic:  this med does not have a protocol to follow, please assess.  Requested Prescriptions  Pending Prescriptions Disp Refills   clindamycin (CLINDAGEL) 1 % gel 30 g 2    Sig: Apply topically 2 (two) times daily. To face     Off-Protocol Failed - 02/14/2021  6:17 PM      Failed - Medication not assigned to a protocol, review manually.      Passed - Valid encounter within last 12 months    Recent Outpatient Visits           1 month ago Annual physical exam   [facility] Glean Hess, MD   2 months ago Viral URI with cough   [facility] Glean Hess, MD   3 months ago Need for immunization against influenza   [facility] Glean Hess, MD   9 months ago Acute cystitis with hematuria   [facility] Glean Hess, MD   10 months ago Internal hemorrhoid, bleeding   [facility] Glean Hess, MD       Future Appointments             In 10 months Army Melia Jesse Sans, MD [facility], [facility]

## 2021-05-30 ENCOUNTER — Ambulatory Visit: Payer: 59 | Admitting: Internal Medicine

## 2021-05-30 ENCOUNTER — Encounter: Payer: Self-pay | Admitting: Internal Medicine

## 2021-05-30 VITALS — BP 128/70 | HR 90 | Ht 61.0 in | Wt 167.0 lb

## 2021-05-30 DIAGNOSIS — F39 Unspecified mood [affective] disorder: Secondary | ICD-10-CM | POA: Diagnosis not present

## 2021-05-30 DIAGNOSIS — M778 Other enthesopathies, not elsewhere classified: Secondary | ICD-10-CM

## 2021-05-30 MED ORDER — PREDNISONE 10 MG PO TABS
ORAL_TABLET | ORAL | 0 refills | Status: AC
Start: 2021-05-30 — End: 2021-06-11

## 2021-05-30 MED ORDER — ESCITALOPRAM OXALATE 5 MG PO TABS: 5.0000 mg | ORAL_TABLET | Freq: Every day | ORAL | 1 refills | Status: AC

## 2021-05-30 NOTE — Progress Notes (Signed)
? ? ?Date:  05/30/2021  ? ?Name:  Isabel Miller   DOB:  09/14/1970   MRN:  [id number] ? ? ?Chief Complaint: Arm Pain ? ?Arm Pain  ?Incident onset: 3 weeks ago. There was no injury mechanism. The pain is present in the left elbow and left shoulder. The quality of the pain is described as aching (sharp). The pain is moderate. The pain has been Constant since the incident. Pertinent negatives include no chest pain, numbness or tingling. The symptoms are aggravated by movement and lifting.  ?Depression ?       This is a recurrent problem.  The problem occurs daily.  The problem has been gradually worsening since onset.  Associated symptoms include no fatigue.     The symptoms are aggravated by social issues.  Past treatments include SSRIs - Selective serotonin reuptake inhibitors.  Previous treatment provided significant (stopped the medications several months ago) relief. ?She is stressed due to a need for change in employment.  Her time here in [place] is ending and she is waiting for a VISA extension from the [place] but it is taking a long time.  She will either move to [place] or to another state but the waiting is difficult. ? ?Lab Results  ?Component Value Date  ? NA 138 11/17/2020  ? K 4.4 11/17/2020  ? CO2 21 11/17/2020  ? GLUCOSE 78 11/17/2020  ? BUN 9 11/17/2020  ? CREATININE 0.48 (L) 11/17/2020  ? CALCIUM 9.8 11/17/2020  ? EGFR 115 11/17/2020  ? GFRNONAA 112 12/26/2019  ? ?Lab Results  ?Component Value Date  ? CHOL 214 (H) 12/29/2020  ? HDL 41 12/29/2020  ? LDLCALC 138 (H) 12/29/2020  ? TRIG 197 (H) 12/29/2020  ? CHOLHDL 5.2 (H) 12/29/2020  ? ?Lab Results  ?Component Value Date  ? TSH 0.802 11/17/2020  ? ?Lab Results  ?Component Value Date  ? HGBA1C 6.1 (H) 11/17/2020  ? ?Lab Results  ?Component Value Date  ? WBC 8.5 11/17/2020  ? HGB 13.9 11/17/2020  ? HCT 41.1 11/17/2020  ? MCV 84 11/17/2020  ? PLT 297 11/17/2020  ? ?Lab Results  ?Component Value Date  ? ALT 32 11/17/2020  ? AST 21 11/17/2020  ?  ALKPHOS 89 11/17/2020  ? BILITOT 0.2 11/17/2020  ? ?No results found for: 25OHVITD2, [place], VD25OH  ? ?Review of Systems  ?Constitutional:  Positive for unexpected weight change. Negative for chills and fatigue.  ?Respiratory:  Negative for chest tightness and shortness of breath.   ?Cardiovascular:  Negative for chest pain.  ?Musculoskeletal:  Positive for arthralgias (left elbow).  ?Neurological:  Negative for dizziness, tingling and numbness.  ?Psychiatric/Behavioral:  Positive for depression and dysphoric mood. Negative for sleep disturbance. The patient is nervous/anxious.   ? ?Patient Active Problem List  ? Diagnosis Date Noted  ? Prediabetes 11/17/2020  ? Acne vulgaris 05/19/2020  ? Enlarged submental lymph node 05/19/2020  ? Carpal tunnel syndrome of right wrist 04/05/2020  ? S/P hysterectomy 12/26/2019  ? Eczema 11/03/2019  ? Edema of both lower legs 11/03/2019  ? Bleeding external hemorrhoids 11/03/2019  ? Mixed hyperlipidemia 01/23/2019  ? Environmental and seasonal allergies 09/19/2017  ? Chronic pain of both knees 09/19/2017  ? Rash and nonspecific skin eruption 02/27/2017  ? Mood disorder ([place]) 02/27/2017  ? ? ?Allergies  ?Allergen Reactions  ? Ivp Dye [Iodinated Contrast Media] Other (See Comments)  ?  convulsion  ? ? ?Past Surgical History:  ?Procedure Laterality Date  ?  CESAREAN SECTION    ? CHOLECYSTECTOMY    ? COLONOSCOPY WITH PROPOFOL N/A 05/23/2019  ? Procedure: COLONOSCOPY WITH PROPOFOL;  Surgeon: Jonathon Bellows, MD;  Location: [facility] ENDOSCOPY;  Service: Gastroenterology;  Laterality: N/A;  ? HERNIA REPAIR    ? PARTIAL HYSTERECTOMY    ? bleeding  ? REDUCTION MAMMAPLASTY Bilateral 2010  ? ? ?Social History  ? ?Tobacco Use  ? Smoking status: Never  ? Smokeless tobacco: Never  ?Vaping Use  ? Vaping Use: Never used  ?Substance Use Topics  ? Alcohol use: Yes  ?  Alcohol/week: 1.0 standard drink  ?  Types: 1 Glasses of wine per week  ?  Comment: occas  ? Drug use: No  ? ? ? ?Medication list has been  reviewed and updated. ? ?Current Meds  ?Medication Sig  ? cholecalciferol (VITAMIN D3) 25 MCG (1000 UNIT) tablet Take 1,000 Units by mouth daily.  ? clindamycin (CLINDAGEL) 1 % gel Apply topically 2 (two) times daily. To face  ? Glucosamine HCl (GLUCOSAMINE PO) Take 1 tablet by mouth 3 (three) times a week.  ? Lidocaine, Anorectal, 5 % GEL Apply 1 application topically in the morning, at noon, in the evening, and at bedtime.  ? Multiple Vitamins-Minerals (MULTIVITAMIN WITH MINERALS) tablet Take 1 tablet by mouth daily.  ? Multiple Vitamins-Minerals (ZINC PO) Take by mouth.  ? pramoxine-hydrocortisone (PROCTOCREAM-HC) 1-1 % rectal cream Place 1 application rectally 2 (two) times daily.  ? predniSONE (DELTASONE) 10 MG tablet Take 6 on day 1and 2, 5 on day 3 and 4, 4 on day 5 and 6 , 3 on day 7 and 8, 2 on day 9 and 10 and 1 on day 11 and 12 then stop.  ? ? ? ?  05/30/2021  ?  3:59 PM 12/29/2020  ?  8:12 AM 11/30/2020  ?  1:33 PM 11/17/2020  ?  2:44 PM  ?GAD 7 : Generalized Anxiety Score  ?Nervous, Anxious, on Edge _0 ?Control/stop worrying 1 0 1 1  ?Worry too much - different things _1 ?Trouble relaxing _2 ?Restless 2 0 1 1  ?Easily annoyed or irritable 2 0 1 1  ?Afraid - awful might happen _3 ?Total GAD 7 Score _4 ?Anxiety Difficulty Very difficult Not difficult at all Not difficult at all Not difficult at all  ? ? ? ?  05/30/2021  ?  3:59 PM  ?Depression screen PHQ 2/9  ?Decreased Interest 1  ?Down, Depressed, Hopeless 1  ?PHQ - 2 Score 2  ?Altered sleeping 1  ?Tired, decreased energy 1  ?Change in appetite 2  ?Feeling bad or failure about yourself  2  ?Trouble concentrating 2  ?Moving slowly or fidgety/restless 3  ?Suicidal thoughts 0  ?PHQ-9 Score 13  ?Difficult doing work/chores Very difficult  ? ? ?BP Readings from Last 3 Encounters:  ?05/30/21 128/70  ?12/29/20 138/78  ?11/30/20 130/84  ? ? ?Physical Exam ?Vitals and nursing note reviewed.  ?Constitutional:   ?   General: She is not  in acute distress. ?   Appearance: Normal appearance. She is well-developed.  ?HENT:  ?   Head: Normocephalic and atraumatic.  ?Cardiovascular:  ?   Rate and Rhythm: Normal rate and regular rhythm.  ?Pulmonary:  ?   Effort: Pulmonary effort is normal. No respiratory distress.  ?   Breath sounds: No wheezing or rhonchi.  ?Musculoskeletal:  ?  Left elbow: Swelling present. No deformity. Normal range of motion. Tenderness present.  ?   Cervical back: Normal range of motion.  ?Lymphadenopathy:  ?   Cervical: No cervical adenopathy.  ?Skin: ?   General: Skin is warm and dry.  ?   Findings: No rash.  ?Neurological:  ?   Mental Status: She is alert and oriented to person, place, and time.  ?Psychiatric:     ?   Mood and Affect: Mood normal.     ?   Behavior: Behavior normal.  ? ? ?Wt Readings from Last 3 Encounters:  ?05/30/21 167 lb (75.8 kg)  ?12/29/20 158 lb 12.8 oz (72 kg)  ?11/30/20 161 lb (73 kg)  ? ? ?BP 128/70   Pulse 90   Ht _0  (1.549 m)   Wt 167 lb (75.8 kg)   SpO2 97%   BMI 31.55 kg/m?  ? ?Assessment and Plan: ?1. Left elbow tendonitis ?Continue soft brace during the day but remove for sleep. ?Use heat or ice plus topical rubs. ?12 day steroid taper. ?- predniSONE (DELTASONE) 10 MG tablet; Take 6 on day 1and 2, 5 on day 3 and 4, 4 on day 5 and 6 , 3 on day 7 and 8, 2 on day 9 and 10 and 1 on day 11 and 12 then stop.  Dispense: 42 tablet; Refill: 0 ? ?2. Mood disorder ([place]) ?Recommend resuming Lexapro 5 mg per day ?Follow up here if no improvement. ?- escitalopram (LEXAPRO) 5 MG tablet; Take 1 tablet (5 mg total) by mouth daily.  Dispense: 90 tablet; Refill: 1 ? ? ?Partially dictated using Editor, commissioning. Any errors are unintentional. ? ?Halina Maidens, MD ?[facility] ?[facility] ? ?05/30/2021 ? ? ? ? ? ?

## 2021-06-13 ENCOUNTER — Telehealth: Payer: Self-pay

## 2021-06-13 ENCOUNTER — Telehealth: Payer: Self-pay | Admitting: Internal Medicine

## 2021-06-13 ENCOUNTER — Ambulatory Visit: Payer: Self-pay | Admitting: *Deleted

## 2021-06-13 NOTE — Telephone Encounter (Signed)
Copied from [place]. Topic: General - Other ?>> Jun 13, 2021 12:39 PM [name], [place] C wrote: ?Reason for CRM: pt called in for assistance. Pt says that provider give her treatments. Pt says that the treatment is causing pain. Pt says that she was prescribed prednisone. Pt would like to know if she should continue taking this medication? If so, she would like to have a Rx sent in to her pharmacy.  ? ?Pharmacy: [facility] STORE [place], [place] [address] AT [place]  ?[place], [place] [address]  ?Phone:  [phone] ?Fax:  [phone] ?

## 2021-06-13 NOTE — Telephone Encounter (Signed)
Completed call back with patient and sent message to provider regarding medication request.  ?

## 2021-06-13 NOTE — Telephone Encounter (Unsigned)
Pt also says that she is going out of the [place] 3 [place] and would like to know if provider would send in a Rx for escitalopram (LEXAPRO) 5 MG tablet? Pt says that she received 90 tablets in the past.  ? ?Pharmacy: [facility]ORE [place], [place] [address] AT [place]  ?Phone:  [phone] ?Fax:  [phone] ? ?Last ov: 05/30/21 ?Future ov: 01/03/22 ?

## 2021-06-13 NOTE — Telephone Encounter (Signed)
Called pt left VM to call back. Pt is to continue taking prednisone. If she is having nausea and vomiting pt is to stop taking. Prednisone helps with inflammation. It should not be the cause of her pain. ? ?[place] nurse may give results to patient if they return call to clinic, a CRM has been created. ? ?KP

## 2021-06-13 NOTE — Telephone Encounter (Signed)
Patient called back after VM. Reviewed message from [place] with patient to continue taking prednisone and patient reports she has completed course of prednisone. Patient would like to know if there is any additional medication to help with pain in left elbow,and difficulty moving elbow with out pain. ?Patient also would like to know if she will be able to get additional refills on medication prior to the last week in June . Patient reports she will be going out of the country for approx. Greater then 3 months . Medication lexapor 5 mg. Is requested medication to have refills. Please advise . ?

## 2021-06-13 NOTE — Telephone Encounter (Signed)
Please review. Pt still having arm pain wants something else to take for pain. Prednisone completed. ? ?KP

## 2021-06-13 NOTE — Telephone Encounter (Signed)
Pt informed. Appt scheduled with Dr. Zigmund Daniel for 06/23/21. ? ?KP ?

## 2021-06-23 ENCOUNTER — Ambulatory Visit: Payer: 59 | Admitting: Family Medicine

## 2021-06-23 ENCOUNTER — Encounter: Payer: Self-pay | Admitting: Family Medicine

## 2021-06-23 DIAGNOSIS — M7521 Bicipital tendinitis, right shoulder: Secondary | ICD-10-CM

## 2021-06-23 DIAGNOSIS — M7712 Lateral epicondylitis, left elbow: Secondary | ICD-10-CM

## 2021-06-23 MED ORDER — DICLOFENAC SODIUM 75 MG PO TBEC: 75.0000 mg | DELAYED_RELEASE_TABLET | Freq: Two times a day (BID) | ORAL | 0 refills | Status: DC

## 2021-06-23 NOTE — Assessment & Plan Note (Signed)
Patient with recently noted right anterior shoulder pain in the setting of several week history of left lateral elbow pain.  Examination findings are most consistent with biceps tendinitis with focal tenderness at the bicipital groove, positive speeds, equivocal Yergason's, equivocal impingement features noted as well without overt rotator cuff weakness.  Her findings are consistent with biceps tendinitis which can be considered secondary/compensatory in nature.  She will initiate scheduled diclofenac 75 mg x 2 weeks, home-based exercises, and maintain close follow-up for reevaluation.  For suboptimal progress ultrasound-guided injections to be considered.

## 2021-06-23 NOTE — Assessment & Plan Note (Signed)
Left-hand-dominant patient presenting with atraumatic left lateral elbow pain ongoing for roughly 6 weeks.  Does state that there has been increased administrative activity and a reduction having her usual workouts over the interval at the onset.  Pain with ADLs, lifting, etc.  Of note, she has began to note anterior right shoulder pain.  Denies any similar symptoms in the past.  She did visit her PCP Dr. Halina Maidens for this issue who wrote a course of steroids for the patient which completely resolved her symptoms, unfortunately after steroid course completed, pain returned to prior level of symptomatology.  Examination of the left elbow reveals limited extension compared to contralateral due to pain, focal tenderness that recreates her symptoms of the lateral epicondyle, provocative testing is consistent with same, subtle tenderness at the medial epicondyle without concomitant provocative testing being positive.  Her findings are most consistent with overuse related left lateral epicondylitis, treatment strategies were reviewed inclusive of medications, injections, and rehab.  She has opted to proceed with scheduled NSAID dosing x2 weeks, diclofenac prescribed, home-based rehab with a focus on flexibility throughout the extensor forearm, and close follow-up in 2 weeks if suboptimal progress noted at which time we can consider ultrasound-guided corticosteroid injections.

## 2021-06-23 NOTE — Patient Instructions (Signed)
-   Start diclofenac every a.m. and every p.m. scheduled (take with food) - Start home exercises for left elbow and right shoulder this weekend/early next week, continue daily - Can use ice for additional local pain control - Perform activities as tolerated using symptoms as a guide - Return in 2 weeks if symptoms fail to adequately improve - Contact us for any questions between now and then

## 2021-06-23 NOTE — Progress Notes (Signed)
Primary Care / Sports Medicine Office Visit  Patient Information:  Patient ID: Isabel Miller, female DOB: 1970/07/04 Age: 51 y.o. MRN: [id number]   [name] is a pleasant 51 y.o. female presenting with the following:  Chief Complaint  Patient presents with   Elbow Pain    Left elbow pain for 3 weeks. Hurts when touching at the moment. Is starting to hurt with lifting.     Vitals:   06/23/21 1442  BP: 128/82  Pulse: 89  SpO2: 98%   Vitals:   06/23/21 1442  Weight: 164 lb (74.4 kg)  Height: '5\' 1"'$  (1.549 m)   Body mass index is 30.99 kg/m.  No results found.   Independent interpretation of notes and tests performed by another provider:   None  Procedures performed:   None  Pertinent History, Exam, Impression, and Recommendations:   Problem List Items Addressed This Visit       Musculoskeletal and Integument   Lateral epicondylitis, left elbow    Left-hand-dominant patient presenting with atraumatic left lateral elbow pain ongoing for roughly 6 weeks.  Does state that there has been increased administrative activity and a reduction having her usual workouts over the interval at the onset.  Pain with ADLs, lifting, etc.  Of note, she has began to note anterior right shoulder pain.  Denies any similar symptoms in the past.  She did visit her PCP Dr. Halina Maidens for this issue who wrote a course of steroids for the patient which completely resolved her symptoms, unfortunately after steroid course completed, pain returned to prior level of symptomatology.  Examination of the left elbow reveals limited extension compared to contralateral due to pain, focal tenderness that recreates her symptoms of the lateral epicondyle, provocative testing is consistent with same, subtle tenderness at the medial epicondyle without concomitant provocative testing being positive.  Her findings are most consistent with overuse related left  lateral epicondylitis, treatment strategies were reviewed inclusive of medications, injections, and rehab.  She has opted to proceed with scheduled NSAID dosing x2 weeks, diclofenac prescribed, home-based rehab with a focus on flexibility throughout the extensor forearm, and close follow-up in 2 weeks if suboptimal progress noted at which time we can consider ultrasound-guided corticosteroid injections.       Relevant Medications   diclofenac (VOLTAREN) 75 MG EC tablet   Biceps tendinitis, right    Patient with recently noted right anterior shoulder pain in the setting of several week history of left lateral elbow pain.  Examination findings are most consistent with biceps tendinitis with focal tenderness at the bicipital groove, positive speeds, equivocal Yergason's, equivocal impingement features noted as well without overt rotator cuff weakness.  Her findings are consistent with biceps tendinitis which can be considered secondary/compensatory in nature.  She will initiate scheduled diclofenac 75 mg x 2 weeks, home-based exercises, and maintain close follow-up for reevaluation.  For suboptimal progress ultrasound-guided injections to be considered.       Relevant Medications   diclofenac (VOLTAREN) 75 MG EC tablet     Orders & Medications Meds ordered this encounter  Medications   diclofenac (VOLTAREN) 75 MG EC tablet    Sig: Take 1 tablet (75 mg total) by mouth 2 (two) times daily.    Dispense:  30 tablet    Refill:  0   No orders of the defined types were placed in this encounter.    No follow-ups on file.  Montel Culver, MD   Primary Care Sports Medicine [place]

## 2021-07-09 ENCOUNTER — Other Ambulatory Visit: Payer: Self-pay | Admitting: Family Medicine

## 2021-07-09 DIAGNOSIS — M7712 Lateral epicondylitis, left elbow: Secondary | ICD-10-CM

## 2021-07-09 DIAGNOSIS — M7521 Bicipital tendinitis, right shoulder: Secondary | ICD-10-CM

## 2021-07-11 NOTE — Telephone Encounter (Signed)
Requested Prescriptions  Pending Prescriptions Disp Refills  . diclofenac (VOLTAREN) 75 MG EC tablet [Pharmacy Med Name: DICLOFENAC SODIUM 75MG DR TABLETS] 30 tablet 0    Sig: TAKE 1 TABLET(75 MG) BY MOUTH TWICE DAILY     Analgesics:  NSAIDS Failed - 07/09/2021  3:38 AM      Failed - Manual Review: Labs are only required if the patient has taken medication for more than 8 weeks.      Failed - Cr in normal range and within 360 days    Creatinine, Ser  Date Value Ref Range Status  11/17/2020 0.48 (L) 0.57 - 1.00 mg/dL Final         Passed - HGB in normal range and within 360 days    Hemoglobin  Date Value Ref Range Status  11/17/2020 13.9 11.1 - 15.9 g/dL Final         Passed - PLT in normal range and within 360 days    Platelets  Date Value Ref Range Status  11/17/2020 297 150 - 450 x10E3/uL Final         Passed - HCT in normal range and within 360 days    Hematocrit  Date Value Ref Range Status  11/17/2020 41.1 34.0 - 46.6 % Final         Passed - eGFR is 30 or above and within 360 days    GFR calc Af Amer  Date Value Ref Range Status  12/26/2019 130 >59 mL/min/1.73 Final    Comment:    **In accordance with recommendations from the NKF-ASN Task force,**   [facility] is in the process of updating its eGFR calculation to the   2021 CKD-EPI creatinine equation that estimates kidney function   without a race variable.    GFR calc non Af Amer  Date Value Ref Range Status  12/26/2019 112 >59 mL/min/1.73 Final   eGFR  Date Value Ref Range Status  11/17/2020 115 >59 mL/min/1.73 Final         Passed - Patient is not pregnant      Passed - Valid encounter within last 12 months    Recent Outpatient Visits          2 weeks ago Lateral epicondylitis, left elbow   [facility] Montel Culver, MD   1 month ago Left elbow tendonitis   [facility] Glean Hess, MD   6 months ago Annual physical exam   [facility] Glean Hess, MD   7  months ago Viral URI with cough   [facility] Glean Hess, MD   7 months ago Need for immunization against influenza   [facility] Glean Hess, MD      Future Appointments            In 5 months Army Melia Jesse Sans, MD [facility]

## 2021-10-21 ENCOUNTER — Telehealth: Payer: Self-pay | Admitting: Internal Medicine

## 2021-10-21 NOTE — Telephone Encounter (Signed)
Emailed to the email provided.

## 2021-10-21 NOTE — Telephone Encounter (Signed)
Copied from [place] [id number]. Topic: General - Inquiry >> Oct 21, 2021 12:09 PM Marcellus Scott wrote: Reason for CRM: [place] and needs her most recent or last lab results, including the gynecological test and mammogram, to be emailed to her. Pt stated she is [place] and has no access to [employer]. She has tried multiple times but cannot access it.   [email or web address]  Please advise.

## 2022-01-02 ENCOUNTER — Encounter: Payer: Self-pay | Admitting: Internal Medicine

## 2022-01-02 NOTE — Progress Notes (Unsigned)
Date:  01/03/2022   Name:  Isabel Miller   DOB:  [birth year]   MRN:  [id number]   Chief Complaint: No chief complaint on file. Isabel Miller is a 51 y.o. female who presents today for her Complete Annual Exam. She feels {DESC; WELL/FAIRLY WELL/POORLY:18703}. She reports exercising ***. She reports she is sleeping {DESC; WELL/FAIRLY WELL/POORLY:18703}. Breast complaints ***.  Mammogram: 02/2021 DEXA: none Pap smear: discontinued Colonoscopy: 05/2019 repeat 5 yrs.  Health Maintenance Due  Topic Date Due   HIV Screening  Never done   Zoster Vaccines- Shingrix (1 of 2) Never done   INFLUENZA VACCINE  09/06/2021   COVID-19 Vaccine (4 - 2023-24 season) 10/07/2021    Immunization History  Administered Date(s) Administered   Influenza,inj,Quad PF,6+ Mos 12/20/2017, 10/26/2018, 11/17/2020   Influenza-Unspecified 11/13/2019   PFIZER(Purple Top)SARS-COV-2 Vaccination 04/06/2019, 04/28/2019, 11/11/2019   PPD Test 09/08/2016    Depression        This is a chronic problem.The problem is unchanged.  Associated symptoms include no fatigue and no headaches.  Past treatments include SSRIs - Selective serotonin reuptake inhibitors. Rash This is a recurrent (acne) problem. The affected locations include the face. Pertinent negatives include no congestion, cough, diarrhea, fatigue, fever, shortness of breath or vomiting. Past treatments include antibiotic cream. The treatment provided significant relief.    Lab Results  Component Value Date   NA 138 11/17/2020   K 4.4 11/17/2020   CO2 21 11/17/2020   GLUCOSE 78 11/17/2020   BUN 9 11/17/2020   CREATININE 0.48 (L) 11/17/2020   CALCIUM 9.8 11/17/2020   EGFR 115 11/17/2020   GFRNONAA 112 12/26/2019   Lab Results  Component Value Date   CHOL 214 (H) 12/29/2020   HDL 41 12/29/2020   LDLCALC 138 (H) 12/29/2020   TRIG 197 (H) 12/29/2020   CHOLHDL 5.2 (H) 12/29/2020   Lab Results  Component Value Date    TSH 0.802 11/17/2020   Lab Results  Component Value Date   HGBA1C 6.1 (H) 11/17/2020   Lab Results  Component Value Date   WBC 8.5 11/17/2020   HGB 13.9 11/17/2020   HCT 41.1 11/17/2020   MCV 84 11/17/2020   PLT 297 11/17/2020   Lab Results  Component Value Date   ALT 32 11/17/2020   AST 21 11/17/2020   ALKPHOS 89 11/17/2020   BILITOT 0.2 11/17/2020   No results found for: "25OHVITD2", "25OHVITD3", "VD25OH"   Review of Systems  Constitutional:  Negative for chills, fatigue and fever.  HENT:  Negative for congestion, hearing loss, tinnitus, trouble swallowing and voice change.   Eyes:  Negative for visual disturbance.  Respiratory:  Negative for cough, chest tightness, shortness of breath and wheezing.   Cardiovascular:  Negative for chest pain, palpitations and leg swelling.  Gastrointestinal:  Negative for abdominal pain, constipation, diarrhea and vomiting.  Endocrine: Negative for polydipsia and polyuria.  Genitourinary:  Negative for dysuria, frequency, genital sores, vaginal bleeding and vaginal discharge.  Musculoskeletal:  Negative for arthralgias, gait problem and joint swelling.  Skin:  Positive for rash. Negative for color change.  Neurological:  Negative for dizziness, tremors, light-headedness and headaches.  Hematological:  Negative for adenopathy. Does not bruise/bleed easily.  Psychiatric/Behavioral:  Positive for depression. Negative for dysphoric mood and sleep disturbance. The patient is not nervous/anxious.     Patient Active Problem List   Diagnosis Date Noted   Lateral epicondylitis, left elbow 06/23/2021   Biceps tendinitis, right  06/23/2021   Prediabetes 11/17/2020   Acne vulgaris 05/19/2020   Enlarged submental lymph node 05/19/2020   Carpal tunnel syndrome of right wrist 04/05/2020   S/P hysterectomy 12/26/2019   Eczema 11/03/2019   Edema of both lower legs 11/03/2019   Bleeding external hemorrhoids 11/03/2019   Mixed hyperlipidemia  01/23/2019   Environmental and seasonal allergies 09/19/2017   Chronic pain of both knees 09/19/2017   Rash and nonspecific skin eruption 02/27/2017   Mood disorder ([place]) 02/27/2017    Allergies  Allergen Reactions   Ivp Dye [Iodinated Contrast Media] Other (See Comments)    convulsion    Past Surgical History:  Procedure Laterality Date   CESAREAN SECTION     CHOLECYSTECTOMY     COLONOSCOPY WITH PROPOFOL N/A 05/23/2019   Procedure: COLONOSCOPY WITH PROPOFOL;  Surgeon: Jonathon Bellows, MD;  Location: [facility] ENDOSCOPY;  Service: Gastroenterology;  Laterality: N/A;   HERNIA REPAIR     PARTIAL HYSTERECTOMY     bleeding   REDUCTION MAMMAPLASTY Bilateral 2010    Social History   Tobacco Use   Smoking status: Never   Smokeless tobacco: Never  Vaping Use   Vaping Use: Never used  Substance Use Topics   Alcohol use: Yes    Alcohol/week: 1.0 standard drink of alcohol    Types: 1 Glasses of wine per week    Comment: occas   Drug use: No     Medication list has been reviewed and updated.  No outpatient medications have been marked as taking for the 01/03/22 encounter (Appointment) with Glean Hess, MD.       06/23/2021    2:44 PM 05/30/2021    3:59 PM 12/29/2020    8:12 AM 11/30/2020    1:33 PM  GAD 7 : Generalized Anxiety Score  Nervous, Anxious, on Edge _0 Control/stop worrying 2 1 0 1  Worry too much - different things _1 Trouble relaxing _2 Restless 2 2 0 1  Easily annoyed or irritable 1 2 0 1  Afraid - awful might happen _3 Total GAD 7 Score _4 Anxiety Difficulty Somewhat difficult Very difficult Not difficult at all Not difficult at all       06/23/2021    2:43 PM 05/30/2021    3:59 PM 12/29/2020    8:12 AM  Depression screen PHQ 2/9  Decreased Interest _5 Down, Depressed, Hopeless _6 PHQ - 2 Score _7 Altered sleeping 2 1 0  Tired, decreased energy _8 Change in appetite 1 2 0  Feeling bad or failure about  yourself  1 2 0  Trouble concentrating 1 2 0  Moving slowly or fidgety/restless 0 3 0  Suicidal thoughts 0 0 0  PHQ-9 Score _9 Difficult doing work/chores Somewhat difficult Very difficult Not difficult at all    BP Readings from Last 3 Encounters:  06/23/21 128/82  05/30/21 128/70  12/29/20 138/78    Physical Exam Vitals and nursing note reviewed.  Constitutional:      General: She is not in acute distress.    Appearance: She is well-developed.  HENT:     Head: Normocephalic and atraumatic.     Right Ear: Tympanic membrane and ear canal normal.     Left Ear: Tympanic membrane and ear canal normal.     Nose:  Right Sinus: No maxillary sinus tenderness.     Left Sinus: No maxillary sinus tenderness.  Eyes:     General: No scleral icterus.       Right eye: No discharge.        Left eye: No discharge.     Conjunctiva/sclera: Conjunctivae normal.  Neck:     Thyroid: No thyromegaly.     Vascular: No carotid bruit.  Cardiovascular:     Rate and Rhythm: Normal rate and regular rhythm.     Pulses: Normal pulses.     Heart sounds: Normal heart sounds.  Pulmonary:     Effort: Pulmonary effort is normal. No respiratory distress.     Breath sounds: No wheezing.  Chest:  Breasts:    Right: No mass, nipple discharge, skin change or tenderness.     Left: No mass, nipple discharge, skin change or tenderness.  Abdominal:     General: Bowel sounds are normal.     Palpations: Abdomen is soft.     Tenderness: There is no abdominal tenderness.  Musculoskeletal:     Cervical back: Normal range of motion. No erythema.     Right lower leg: No edema.     Left lower leg: No edema.  Lymphadenopathy:     Cervical: No cervical adenopathy.  Skin:    General: Skin is warm and dry.     Findings: No rash.  Neurological:     General: No focal deficit present.     Mental Status: She is alert and oriented to person, place, and time.     Cranial Nerves: No cranial nerve deficit.      Sensory: No sensory deficit.     Deep Tendon Reflexes: Reflexes are normal and symmetric.  Psychiatric:        Attention and Perception: Attention normal.        Mood and Affect: Mood normal.     Wt Readings from Last 3 Encounters:  06/23/21 164 lb (74.4 kg)  05/30/21 167 lb (75.8 kg)  12/29/20 158 lb 12.8 oz (72 kg)    There were no vitals taken for this visit.  Assessment and Plan:

## 2022-01-03 ENCOUNTER — Encounter: Payer: Self-pay | Admitting: Internal Medicine

## 2022-01-03 ENCOUNTER — Encounter: Payer: 59 | Admitting: Internal Medicine

## 2022-01-03 DIAGNOSIS — R7303 Prediabetes: Secondary | ICD-10-CM

## 2022-01-03 DIAGNOSIS — Z0000 Encounter for general adult medical examination without abnormal findings: Secondary | ICD-10-CM

## 2022-01-03 DIAGNOSIS — E782 Mixed hyperlipidemia: Secondary | ICD-10-CM

## 2022-01-03 DIAGNOSIS — Z1231 Encounter for screening mammogram for malignant neoplasm of breast: Secondary | ICD-10-CM

## 2022-01-03 DIAGNOSIS — F39 Unspecified mood [affective] disorder: Secondary | ICD-10-CM

## 2022-08-03 IMAGING — MG DIGITAL SCREENING BILAT W/ TOMO W/ CAD
8 series · 8 of 24 positions shown · non-contrast
Comparison: Previous exam(s).

CLINICAL DATA: Screening.

EXAM:
DIGITAL SCREENING BILATERAL MAMMOGRAM WITH TOMO AND CAD

[R MLO synth-2D]
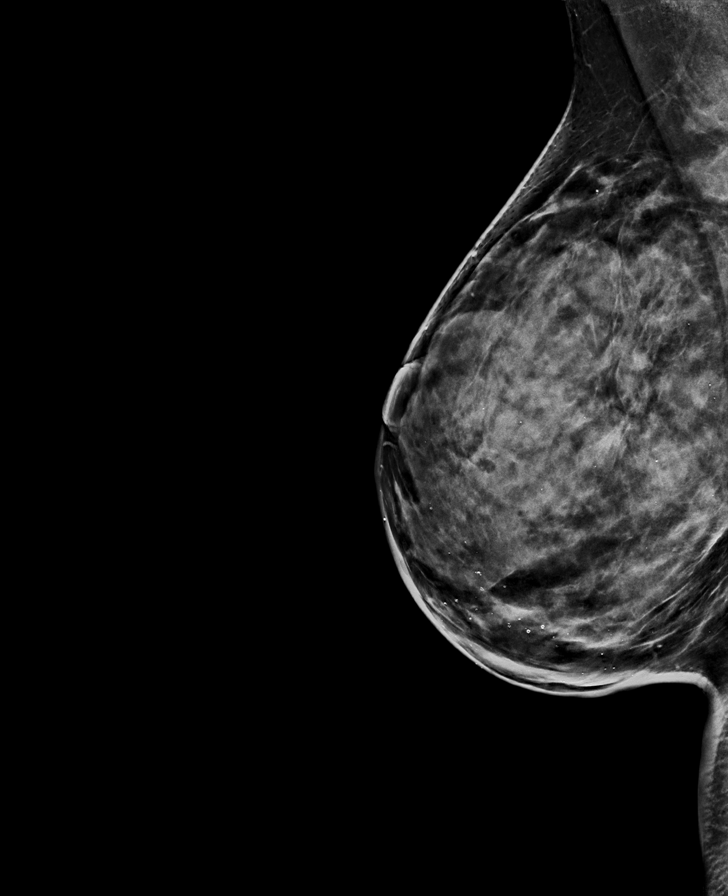

[L CC synth-2D]
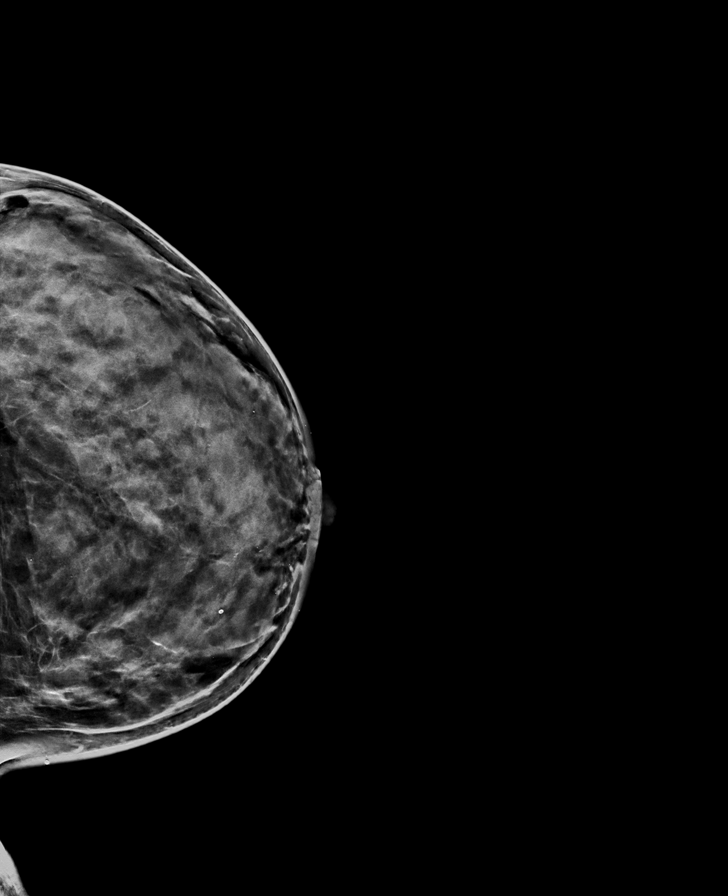

[R CC synth-2D]
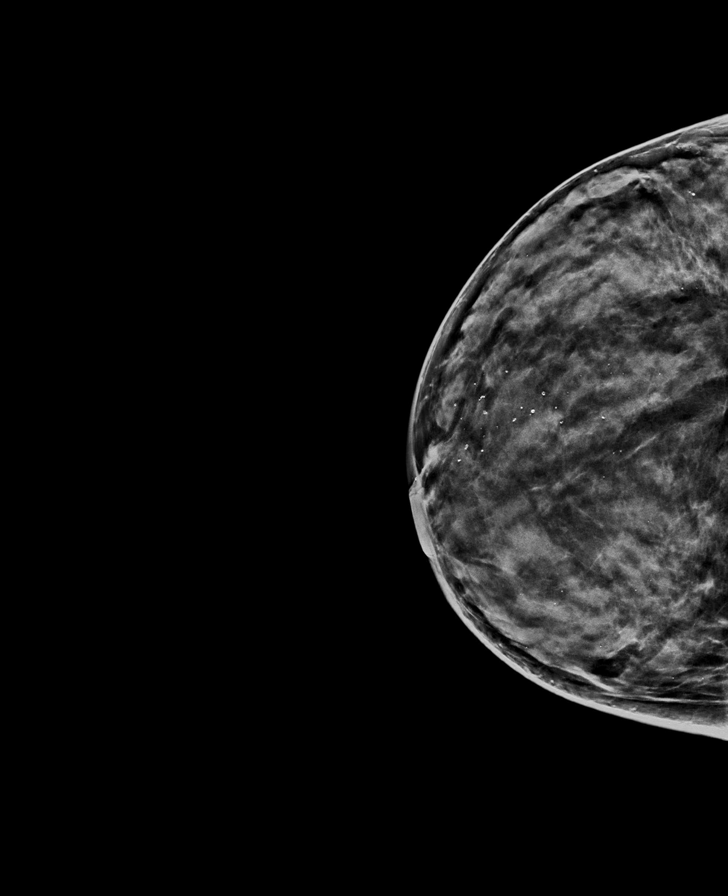

[L MLO synth-2D]
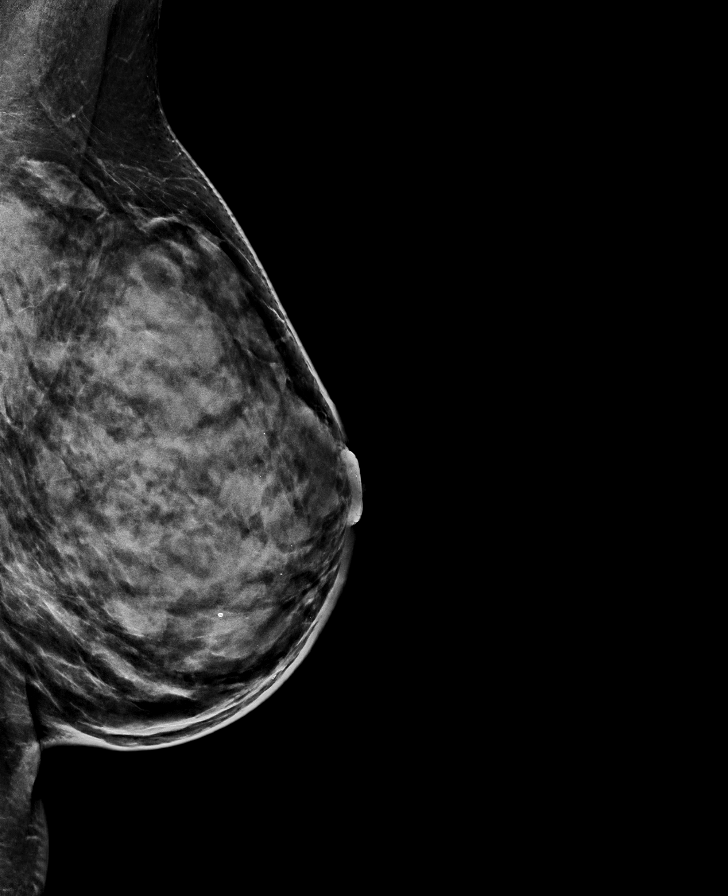

[L MLO tomo · tomo slice 37/73.0]
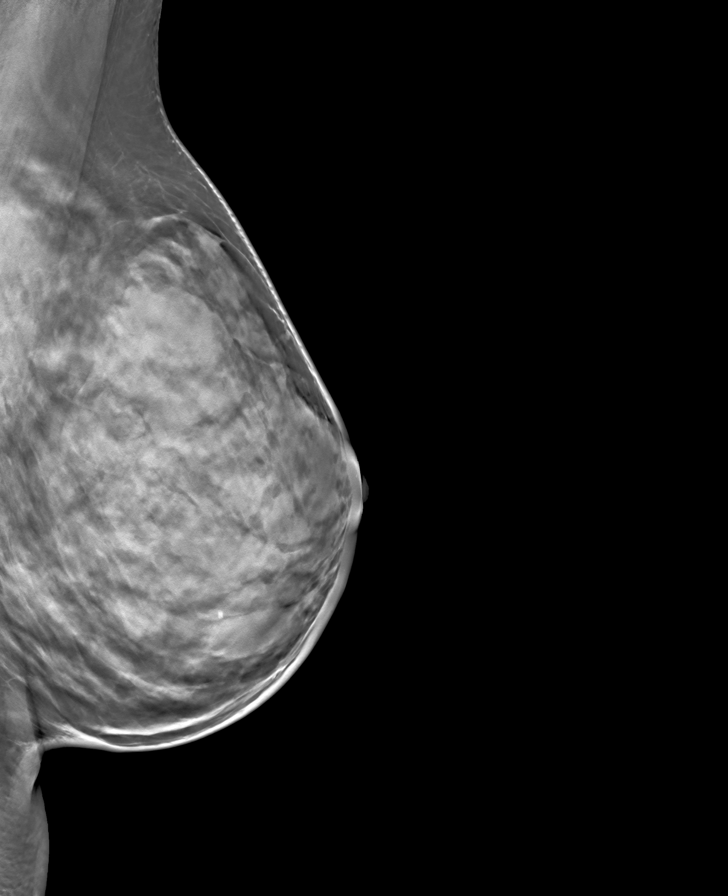

[R CC tomo · tomo slice 33/65.0]
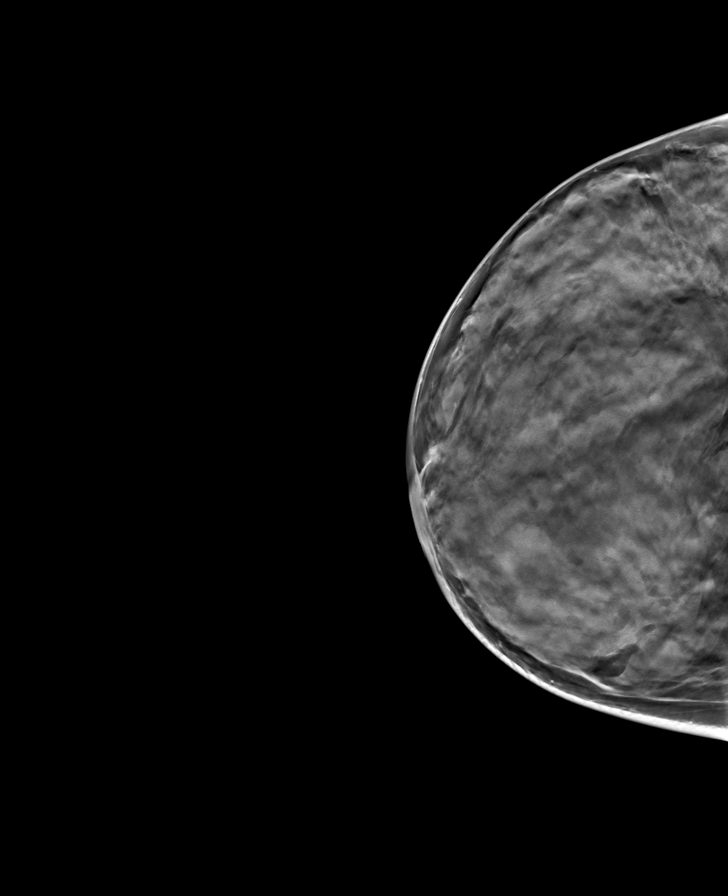

[R MLO tomo · tomo slice 35/70.0]
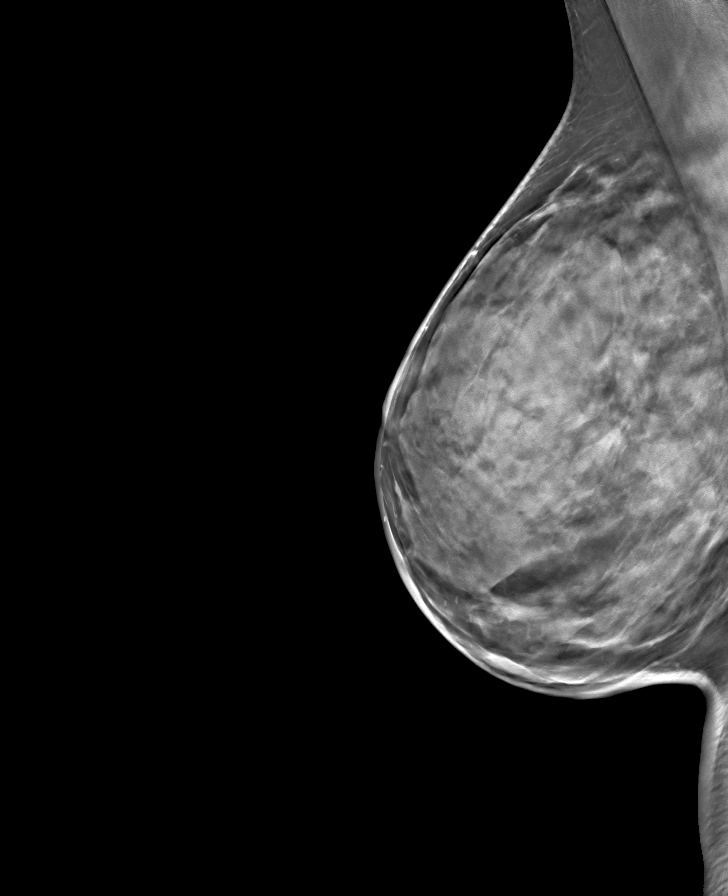

[L CC tomo · tomo slice 37/74.0]
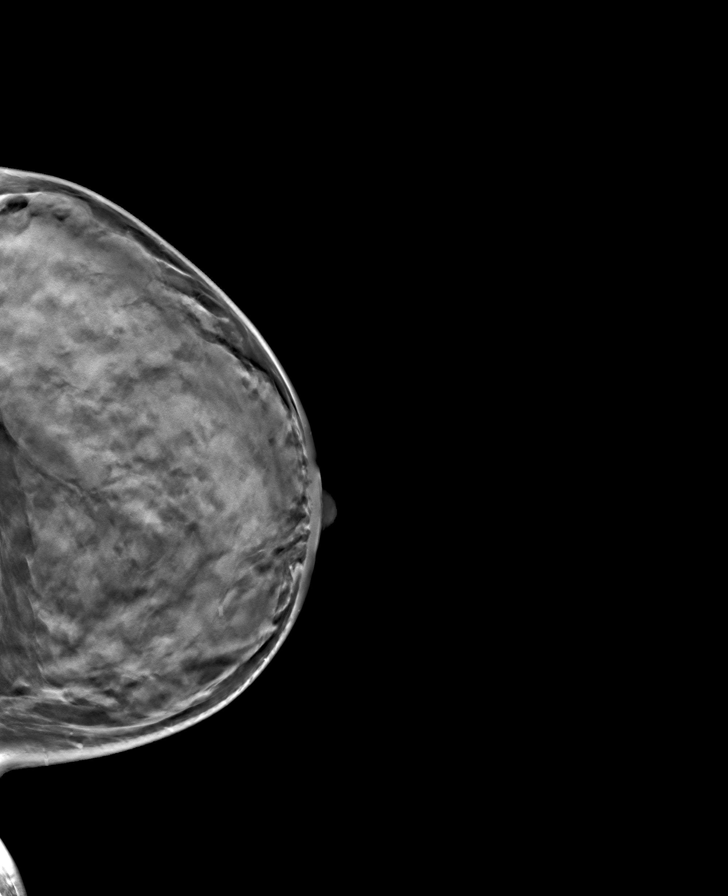

[8 of 24 positions shown; findings below may reference images not displayed]

ACR Breast Density Category d: The breast tissue is extremely dense,
which lowers the sensitivity of mammography
FINDINGS: There are no findings suspicious for malignancy. Images were
processed with CAD.
IMPRESSION: No mammographic evidence of malignancy. A result letter of this
screening mammogram will be mailed directly to the patient.

RECOMMENDATION:
Screening mammogram in one year. (Code:WO-0-ZI0)

BI-RADS CATEGORY  1: Negative.
